# Patient Record
Sex: Male | Born: 1985 | Race: Black or African American | Hispanic: No | Marital: Single | Smoking: Current every day smoker
Health system: Southern US, Community
[De-identification: ages and names within clinical notes are randomized; demographics above are authoritative.]

## PROBLEM LIST (undated history)

## (undated) DIAGNOSIS — R569 Unspecified convulsions: Secondary | ICD-10-CM

## (undated) DIAGNOSIS — I10 Essential (primary) hypertension: Secondary | ICD-10-CM

## (undated) HISTORY — DX: Essential (primary) hypertension: I10

---

## 2014-10-20 ENCOUNTER — Emergency Department (HOSPITAL_COMMUNITY)
Admission: EM | Admit: 2014-10-20 | Discharge: 2014-10-20 | Disposition: A | Discharge status: Home / Self Care | Payer: Self-pay | Attending: Emergency Medicine | Admitting: Emergency Medicine

## 2014-10-20 ENCOUNTER — Encounter (HOSPITAL_COMMUNITY): Payer: Self-pay | Admitting: Emergency Medicine

## 2014-10-20 ENCOUNTER — Emergency Department (HOSPITAL_COMMUNITY): Payer: Self-pay

## 2014-10-20 DIAGNOSIS — Z23 Encounter for immunization: Secondary | ICD-10-CM | POA: Insufficient documentation

## 2014-10-20 DIAGNOSIS — S61011A Laceration without foreign body of right thumb without damage to nail, initial encounter: Secondary | ICD-10-CM | POA: Insufficient documentation

## 2014-10-20 DIAGNOSIS — Y288XXA Contact with other sharp object, undetermined intent, initial encounter: Secondary | ICD-10-CM | POA: Insufficient documentation

## 2014-10-20 DIAGNOSIS — Y9289 Other specified places as the place of occurrence of the external cause: Secondary | ICD-10-CM | POA: Insufficient documentation

## 2014-10-20 DIAGNOSIS — Z792 Long term (current) use of antibiotics: Secondary | ICD-10-CM | POA: Insufficient documentation

## 2014-10-20 DIAGNOSIS — Y9389 Activity, other specified: Secondary | ICD-10-CM | POA: Insufficient documentation

## 2014-10-20 DIAGNOSIS — Z720 Tobacco use: Secondary | ICD-10-CM | POA: Insufficient documentation

## 2014-10-20 DIAGNOSIS — Y990 Civilian activity done for income or pay: Secondary | ICD-10-CM | POA: Insufficient documentation

## 2014-10-20 MED ORDER — TETANUS-DIPHTH-ACELL PERTUSSIS 5-2.5-18.5 LF-MCG/0.5 IM SUSP
0.5000 mL | Freq: Once | INTRAMUSCULAR | Status: AC
  Administered 2014-10-20: 0.5 mL via INTRAMUSCULAR
  Filled 2014-10-20: qty 0.5

## 2014-10-20 MED ORDER — BACITRACIN ZINC 500 UNIT/GM EX OINT
1.0000 "application " | TOPICAL_OINTMENT | Freq: Two times a day (BID) | CUTANEOUS | Status: DC
  Administered 2014-10-20: 1 via TOPICAL
  Filled 2014-10-20: qty 0.9

## 2014-10-20 MED ORDER — TRAMADOL HCL 50 MG PO TABS: 50.0000 mg | ORAL_TABLET | Freq: Four times a day (QID) | ORAL | Status: DC | PRN

## 2014-10-20 MED ORDER — CEPHALEXIN 500 MG PO CAPS: 500.0000 mg | ORAL_CAPSULE | Freq: Four times a day (QID) | ORAL | Status: DC

## 2014-10-20 MED ORDER — LIDOCAINE HCL 2 % IJ SOLN
10.0000 mL | Freq: Once | INTRAMUSCULAR | Status: AC
  Administered 2014-10-20: 200 mg via INTRADERMAL
  Filled 2014-10-20: qty 20

## 2014-10-20 MED ORDER — TRAMADOL HCL 50 MG PO TABS
50.0000 mg | ORAL_TABLET | Freq: Once | ORAL | Status: AC
Start: 2014-10-20 — End: 2014-10-20
  Administered 2014-10-20: 50 mg via ORAL
  Filled 2014-10-20: qty 1

## 2014-10-20 NOTE — Discharge Instructions (Signed)
Delayed Wound Closure Sometimes, your health care provider will decide to delay closing a wound for several days. This is done when the wound is badly bruised, dirty, or when it has been several hours since the injury happened. By delaying the closure of your wound, the risk of infection is reduced. Wounds that are closed in 3-7 days after being cleaned up and dressed heal just as well as those that are closed right away. HOME CARE INSTRUCTIONS  Rest and elevate the injured area until the pain and swelling are gone.  Have your wound checked as instructed by your health care provider. SEEK MEDICAL CARE IF:  You develop unusual or increased swelling or redness around the wound.  You have increasing pain or tenderness.  There is increasing fluid (drainage) or a bad smelling drainage coming from the wound.   This information is not intended to replace advice given to you by your health care provider. Make sure you discuss any questions you have with your health care provider.   Document Released: 12/25/2004 Document Revised: 12/30/2012 Document Reviewed: 06/24/2012 Elsevier Interactive Patient Education 2016 [employer]  Laceration Care, Adult A laceration is a cut that goes through all layers of the skin. The cut also goes into the tissue that is right under the skin. Some cuts heal on their own. Others need to be closed with stitches (sutures), staples, skin adhesive strips, or wound glue. Taking care of your cut lowers your risk of infection and helps your cut to heal better. HOW TO TAKE CARE OF YOUR CUT For stitches or staples:  Keep the wound clean and dry.  If you were given a bandage (dressing), you should change it at least one time per day or as told by your doctor. You should also change it if it gets wet or dirty.  Keep the wound completely dry for the first 24 hours or as told by your doctor. After that time, you may take a shower or a bath. However, make sure that the wound  is not soaked in water until after the stitches or staples have been removed.  Clean the wound one time each day or as told by your doctor:  Wash the wound with soap and water.  Rinse the wound with water until all of the soap comes off.  Pat the wound dry with a clean towel. Do not rub the wound.  After you clean the wound, put a thin layer of antibiotic ointment on it as told by your doctor. This ointment:  Helps to prevent infection.  Keeps the bandage from sticking to the wound.  Have your stitches or staples removed as told by your doctor. If your doctor used skin adhesive strips:   Keep the wound clean and dry.  If you were given a bandage, you should change it at least one time per day or as told by your doctor. You should also change it if it gets dirty or wet.  Do not get the skin adhesive strips wet. You can take a shower or a bath, but be careful to keep the wound dry.  If the wound gets wet, pat it dry with a clean towel. Do not rub the wound.  Skin adhesive strips fall off on their own. You can trim the strips as the wound heals. Do not remove any strips that are still stuck to the wound. They will fall off after a while. If your doctor used wound glue:  Try to keep your wound  dry, but you may briefly wet it in the shower or bath. Do not soak the wound in water, such as by swimming.  After you take a shower or a bath, gently pat the wound dry with a clean towel. Do not rub the wound.  Do not do any activities that will make you really sweaty until the skin glue has fallen off on its own.  Do not apply liquid, cream, or ointment medicine to your wound while the skin glue is still on.  If you were given a bandage, you should change it at least one time per day or as told by your doctor. You should also change it if it gets dirty or wet.  If a bandage is placed over the wound, do not let the tape for the bandage touch the skin glue.  Do not pick at the glue. The  skin glue usually stays on for 5-10 days. Then, it falls off of the skin. General Instructions  To help prevent scarring, make sure to cover your wound with sunscreen whenever you are outside after stitches are removed, after adhesive strips are removed, or when wound glue stays in place and the wound is healed. Make sure to wear a sunscreen of at least 30 SPF.  Take over-the-counter and prescription medicines only as told by your doctor.  If you were given antibiotic medicine or ointment, take or apply it as told by your doctor. Do not stop using the antibiotic even if your wound is getting better.  Do not scratch or pick at the wound.  Keep all follow-up visits as told by your doctor. This is important.  Check your wound every day for signs of infection. Watch for:  Redness, swelling, or pain.  Fluid, blood, or pus.  Raise (elevate) the injured area above the level of your heart while you are sitting or lying down, if possible. GET HELP IF:  You got a tetanus shot and you have any of these problems at the injection site:  Swelling.  Very bad pain.  Redness.  Bleeding.  You have a fever.  A wound that was closed breaks open.  You notice a bad smell coming from your wound or your bandage.  You notice something coming out of the wound, such as wood or glass.  Medicine does not help your pain.  You have more redness, swelling, or pain at the site of your wound.  You have fluid, blood, or pus coming from your wound.  You notice a change in the color of your skin near your wound.  You need to change the bandage often because fluid, blood, or pus is coming from the wound.  You start to have a new rash.  You start to have numbness around the wound. GET HELP RIGHT AWAY IF:  You have very bad swelling around the wound.  Your pain suddenly gets worse and is very bad.  You notice painful lumps near the wound or on skin that is anywhere on your body.  You have a red  streak going away from your wound.  The wound is on your hand or foot and you cannot move a finger or toe like you usually can.  The wound is on your hand or foot and you notice that your fingers or toes look pale or bluish.   This information is not intended to replace advice given to you by your health care provider. Make sure you discuss any questions you have with your health  care provider.   Document Released: 06/13/2007 Document Revised: 05/11/2014 Document Reviewed: 12/21/2013 Elsevier Interactive Patient Education 2016 Else[employer] Inc[employer]  Sutured Wound Care Sutures are stitches that can be used to close wounds. Taking care of your wound properly can help prevent pain and infection. It can also help your wound to heal more quickly. HOW TO CARE FOR YOUR SUTURED WOUND Wound Care  Keep the wound clean and dry.  If you were given a bandage (dressing), change it at least one time per day or as told by your doctor. You should also change it if it gets wet or dirty.  Keep the wound completely dry for the first 24 hours or as told by your doctor. After that time, you may shower or bathe. However, make sure that the wound is not soaked in water until the sutures have been removed.  Clean the wound one time each day or as told by your doctor.  Wash the wound with soap and water.  Rinse the wound with water to remove all soap.  Pat the wound dry with a clean towel. Do not rub the wound.  After cleaning the wound, put a thin layer of antibiotic ointment on it as told your doctor. This ointment:  Helps to prevent infection.  Keeps the bandage from sticking to the wound.  Have the sutures removed as told by your doctor. General Instructions  Take or apply medicines only as told by your doctor.  To help prevent scarring, make sure to cover your wound with sunscreen whenever you are outside after the sutures are removed and the wound is healed. Make sure to wear a sunscreen of at least  30 SPF.  If you were prescribed an antibiotic medicine or ointment, finish all of it even if you start to feel better.  Do not scratch or pick at the wound.  Keep all follow-up visits as told by your doctor. This is important.  Check your wound every day for signs of infection. Watch for:  Redness, swelling, or pain.  Fluid, blood, or pus.  Raise (elevate) the injured area above the level of your heart while you are sitting or lying down, if possible.  Avoid stretching your wound.  Drink enough fluids to keep your pee (urine) clear or pale yellow. GET HELP IF:  You were given a tetanus shot and you have any of these where the needle went in:  Swelling.  Very bad pain.  Redness.  Bleeding.  You have a fever.  A wound that was closed breaks open.  You notice a bad smell coming from the wound.  You notice something coming out of the wound, such as wood or glass.  Medicine does not help your pain.  You have any of these at the site of the wound.  More redness.  More swelling.  More pain.  You have any of these coming from the wound.  Fluid.  Blood.  Pus.  You notice a change in the color of your skin near the wound.  You need to change the bandage often due to fluid, blood, or pus coming from the wound.  You have a new rash.  You have numbness around the wound. GET HELP RIGHT AWAY IF:  You have very bad swelling around the wound.  Your pain suddenly gets worse and is very bad.  You have painful lumps near the wound or on skin that is anywhere on your body.  You have a red streak going away from  the wound.  The wound is on your hand or foot and you cannot move a finger or toe like normal.  The wound is on your hand or foot and you notice that your fingers or toes look pale or bluish.   This information is not intended to replace advice given to you by your health care provider. Make sure you discuss any questions you have with your health care  provider.   Document Released: 06/13/2007 Document Revised: 05/11/2014 Document Reviewed: 08/06/2012 Elsevier Interactive Patient Education [employer]

## 2014-10-20 NOTE — ED Notes (Signed)
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  States that he cut his rt thumb on metal yesterday.  Last tetanus shot unknown.

## 2014-10-20 NOTE — ED Provider Notes (Signed)
CSN: [id number]     Arrival date & time 10/20/14  1234 History  By signing my name below, I, Placido Sou, attest that this documentation has been prepared under the direction and in the presence of Danelle Berry, PA-C. Electronically Signed: Placido Sou, ED Scribe. 10/20/2014. 2:17 PM.   Chief Complaint  Patient presents with  . Extremity Laceration   The history is provided by the patient. No language interpreter was used.    HPI Comments: Jimmy Howard is a 29 y.o. male who presents to the Emergency Department complaining of a laceration with controlled bleeding to his right thumb with onset 1 day ago. 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 Pt notes associated 7/10 pain from the thumb and mild tingling. He denies having taken anything for pain management. He notes applying peroxide initially but denies having irrigated the wound. He denies any bleeding disorders, other known medical issues or drug allergies. Pt received a tdap booster upon arrival today. Pt denies any other associated symptoms.   History reviewed. No pertinent past medical history. No past surgical history on file. No family history on file. Social History  Substance Use Topics  . Smoking status: Current Every Day Smoker  . Smokeless tobacco: None  . Alcohol Use: No    Review of Systems  Constitutional: Negative for fever, chills, diaphoresis and fatigue.  Gastrointestinal: Negative for nausea, vomiting, diarrhea and constipation.  Skin: Positive for color change and wound.  Neurological: Negative for tremors, weakness and numbness.  Hematological: Does not bruise/bleed easily.   Allergies  Review of patient's allergies indicates no known allergies.  Home Medications   Prior to Admission medications   Medication Sig Start Date End Date Taking? Authorizing Provider  cephALEXin (KEFLEX) 500 MG capsule Take 1 capsule (500 mg total) by mouth 4 (four) times daily.  10/20/14   Danelle Berry, PA-C  traMADol (ULTRAM) 50 MG tablet Take 1 tablet (50 mg total) by mouth every 6 (six) hours as needed for severe pain. 10/20/14   Danelle Berry, PA-C   BP 138/78 mmHg  Pulse 82  Temp(Src) 97.9 F (36.6 C) (Oral)  Resp 18  SpO2 100% Physical Exam  Constitutional: He is oriented to person, place, and time. He appears well-developed and well-nourished. No distress.  HENT:  Head: Normocephalic and atraumatic.  Right Ear: External ear normal.  Left Ear: External ear normal.  Nose: Nose normal.  Mouth/Throat: Oropharynx is clear and moist. No oropharyngeal exudate.  Eyes: Conjunctivae and EOM are normal. Pupils are equal, round, and reactive to light. Right eye exhibits no discharge. Left eye exhibits no discharge. No scleral icterus.  Neck: Normal range of motion. Neck supple. No JVD present. No tracheal deviation present.  Cardiovascular: Normal rate and regular rhythm.   Pulmonary/Chest: Effort normal and breath sounds normal. No stridor. No respiratory distress.  Musculoskeletal: Normal range of motion. He exhibits no edema.  Lymphadenopathy:    He has no cervical adenopathy.  Neurological: He is alert and oriented to person, place, and time. He exhibits normal muscle tone. Coordination normal.  Skin: Skin is warm and dry. Laceration noted. No rash noted. He is not diaphoretic. No erythema. No pallor.     Right distal thumb laceration with skin flap approximately 2 cm x 1 cm with exposed subcutaneous tissue, no visible bone or tendon involvement ; pink nailbed; normal cap refill; normal ROM;   Psychiatric: He has a normal mood and affect.  His behavior is normal. Judgment and thought content normal.                  ED Course  Procedures  DIAGNOSTIC STUDIES: Oxygen Saturation is 97% on RA, normal by my interpretation.    LACERATION REPAIR Performed by: Danelle BerryLeisa Rumeal Cullipher Consent: Verbal consent obtained. Risks and benefits: risks, benefits and  alternatives were discussed Patient identity confirmed: provided demographic data Time out performed prior to procedure Prepped and Draped in normal sterile fashion Wound explored Laceration Location: right thumb Laceration Length: 2cm No Foreign Bodies seen or palpated Anesthesia: local infiltration Digital block: lidocaine 2% w/o epi Anesthetic total: 6 mL Irrigation method: syringe Amount of cleaning: standard  Skin closure: 5.0 prolene Number of sutures or staples: 4 Technique: simple interrupted (3) and horizontal mattress suture (1) - to distribute tension Patient tolerance: Patient tolerated the procedure well with no immediate complications.   COORDINATION OF CARE: 2:10 PM Discussed treatment plan with pt at bedside and pt agreed to plan.  Labs Review Labs Reviewed - No data to display  Imaging Review Dg Hand Complete Right  10/20/2014  CLINICAL DATA:  Anterior thumb laceration 1 day ago using table saw. EXAM: RIGHT HAND - COMPLETE 3+ VIEW COMPARISON:  None. FINDINGS: Soft tissue defect noted at the tip of the right thumb. No underlying bony abnormality. No fracture, subluxation or dislocation. No radiopaque foreign body. IMPRESSION: No acute bony abnormality. Electronically Signed   By: Charlett NoseKevin  Dover M.D.   On: 10/20/2014 13:31   I have personally reviewed and evaluated these images as part of my medical decision-making.   EKG Interpretation None     MDM   Final diagnoses:  Thumb laceration, right, initial encounter    Delayed presentation of thumb laceration. The thumb was numbed, irrigated, examined in a bloodless field, excess tissue trimmed and edges approximated using skin flap that had attached dermis (blood supply).  Not all areas were able to be approximated as there were some gaping edges that will need to heal secondarily due to missing tissue.  Proper wound care was reviewed with the pt.  He was advised to have his wound checked in 3-5 days, sutures to  remain 7-10 days, prophylactic Abx given with pain medication. No signs of current infection.  I personally performed the services described in this documentation, which was scribed in my presence. The recorded information has been reviewed and is accurate.    Danelle BerryLeisa Jawad Wiacek, PA-C 10/29/14 [id number]  Arby BarretteMarcy Pfeiffer, MD 11/08/14 [phone]

## 2017-04-16 IMAGING — CR DG HAND COMPLETE 3+V*R*
3 series · 3 of 3 positions shown · non-contrast
Comparison: None.

CLINICAL DATA: Anterior thumb laceration 1 day ago using table saw.

EXAM:
RIGHT HAND - COMPLETE 3+ VIEW

[x hand pa right]
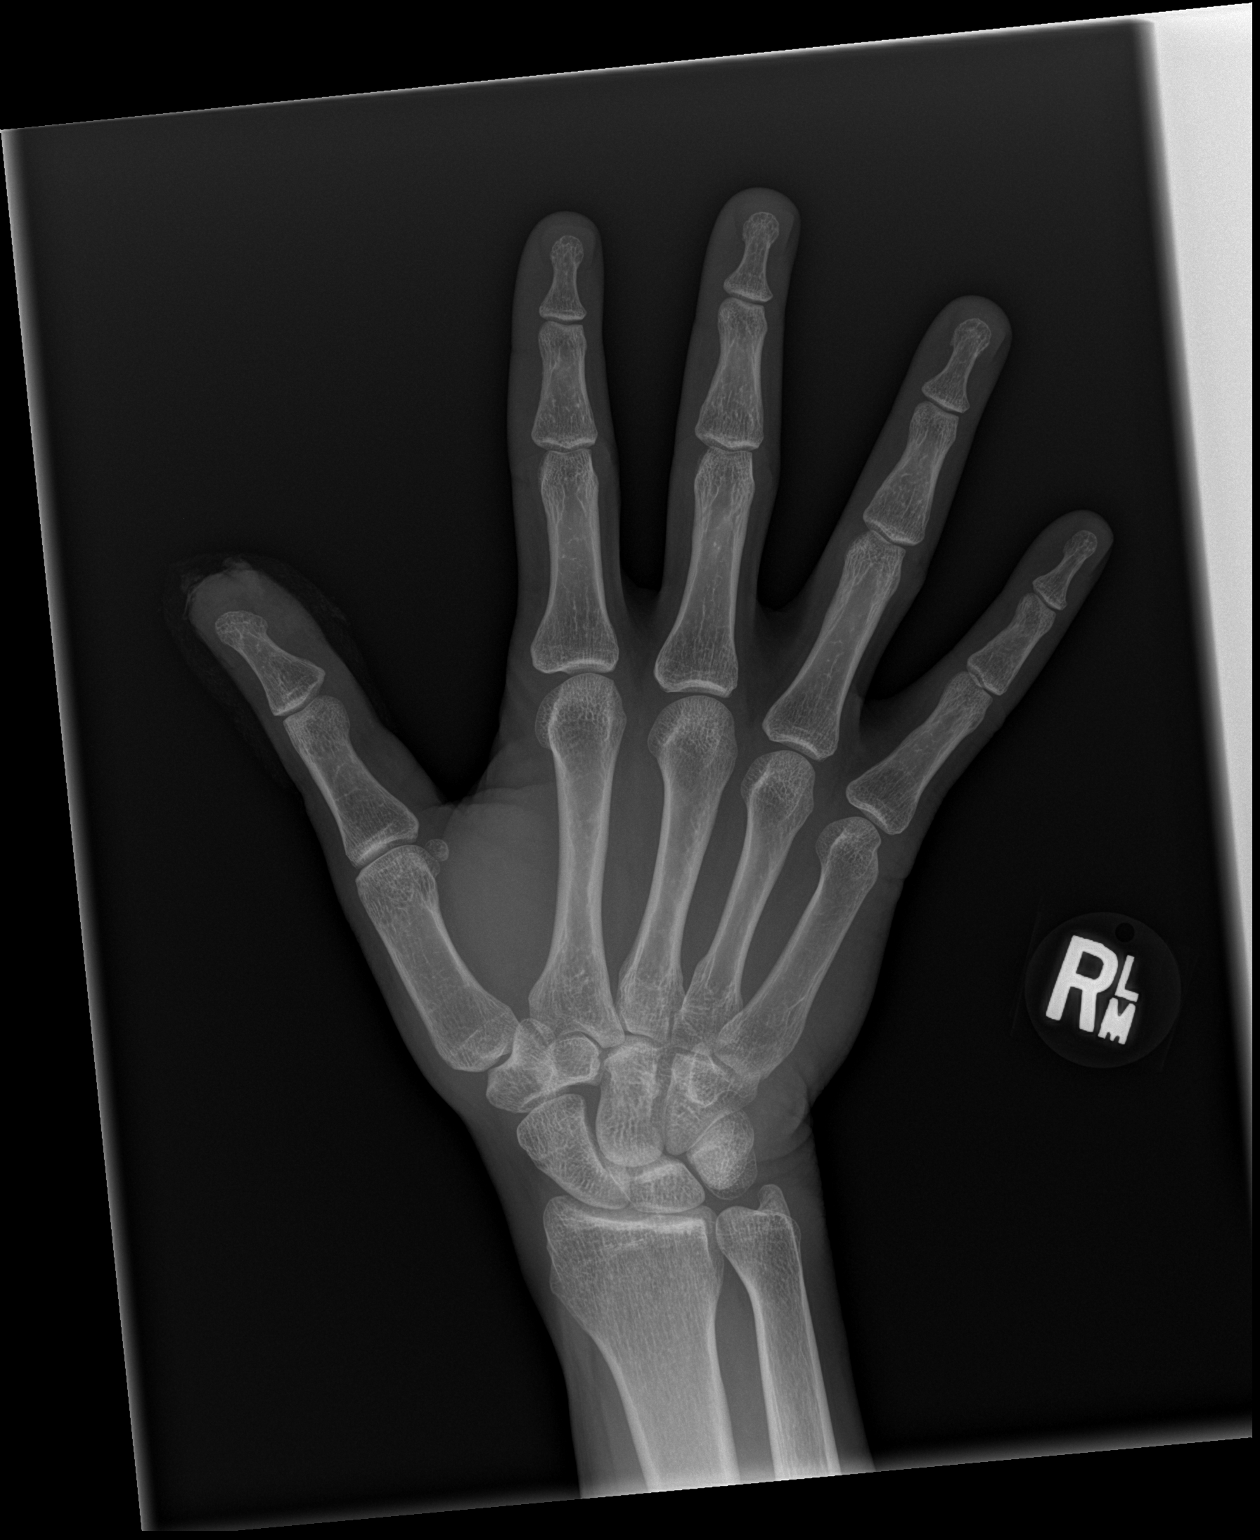

[x hand obl right]
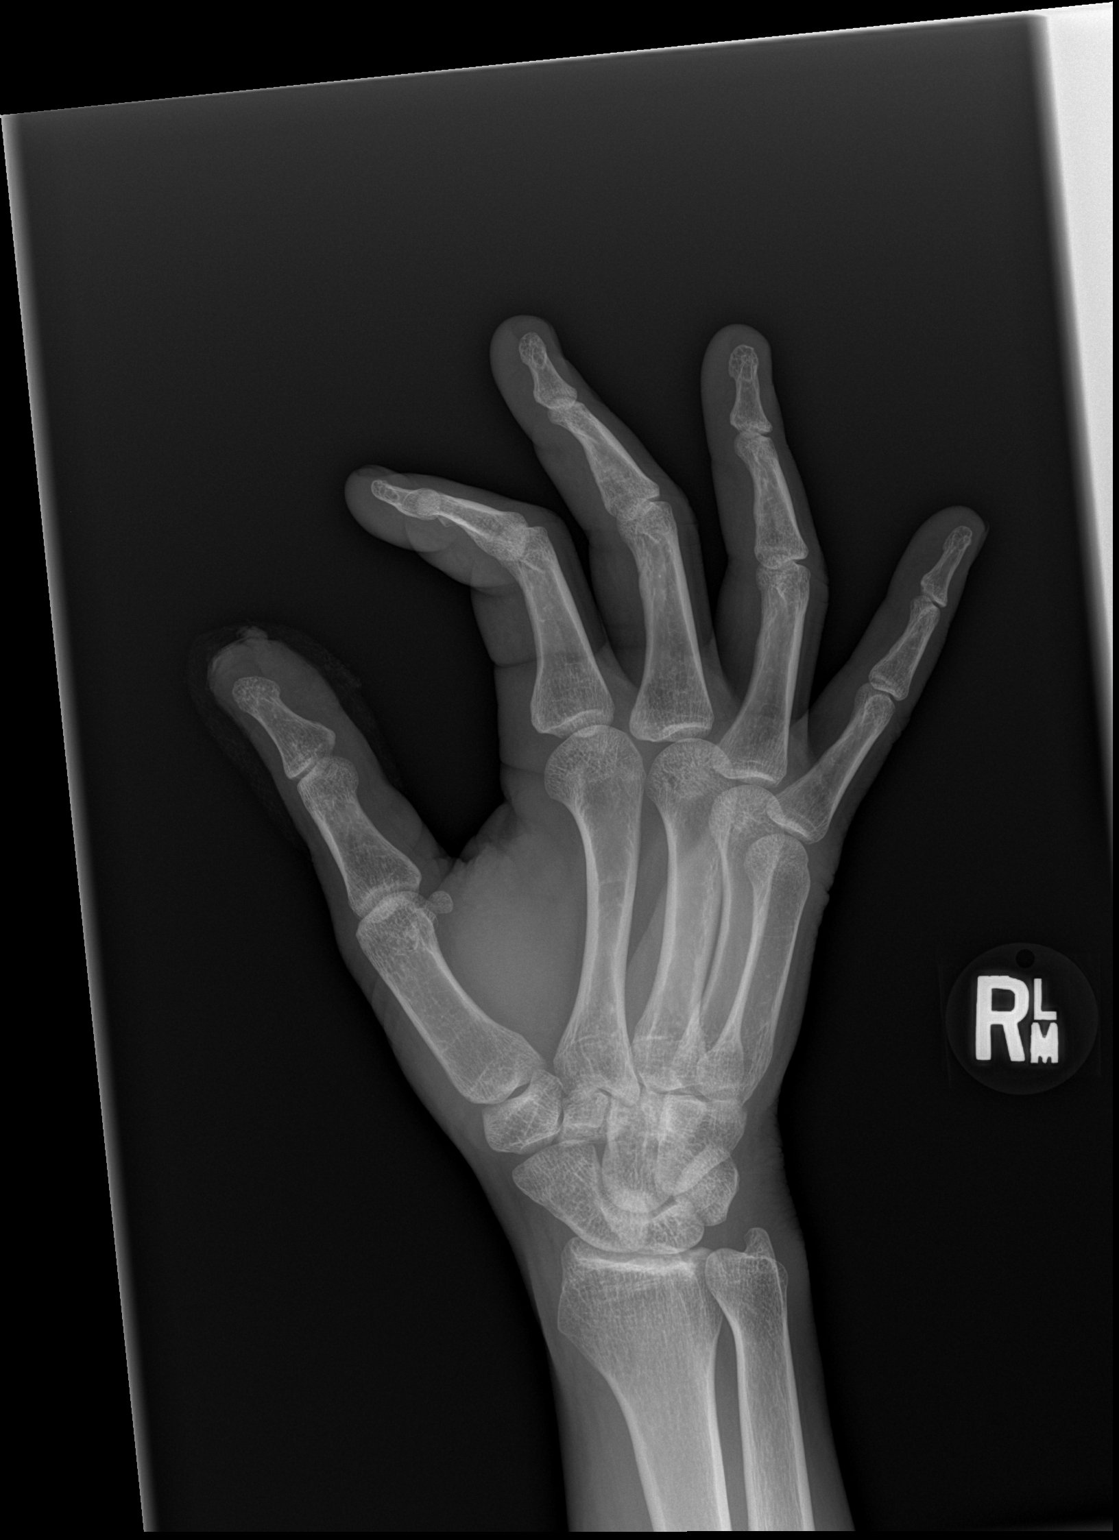

[x hand lat right]
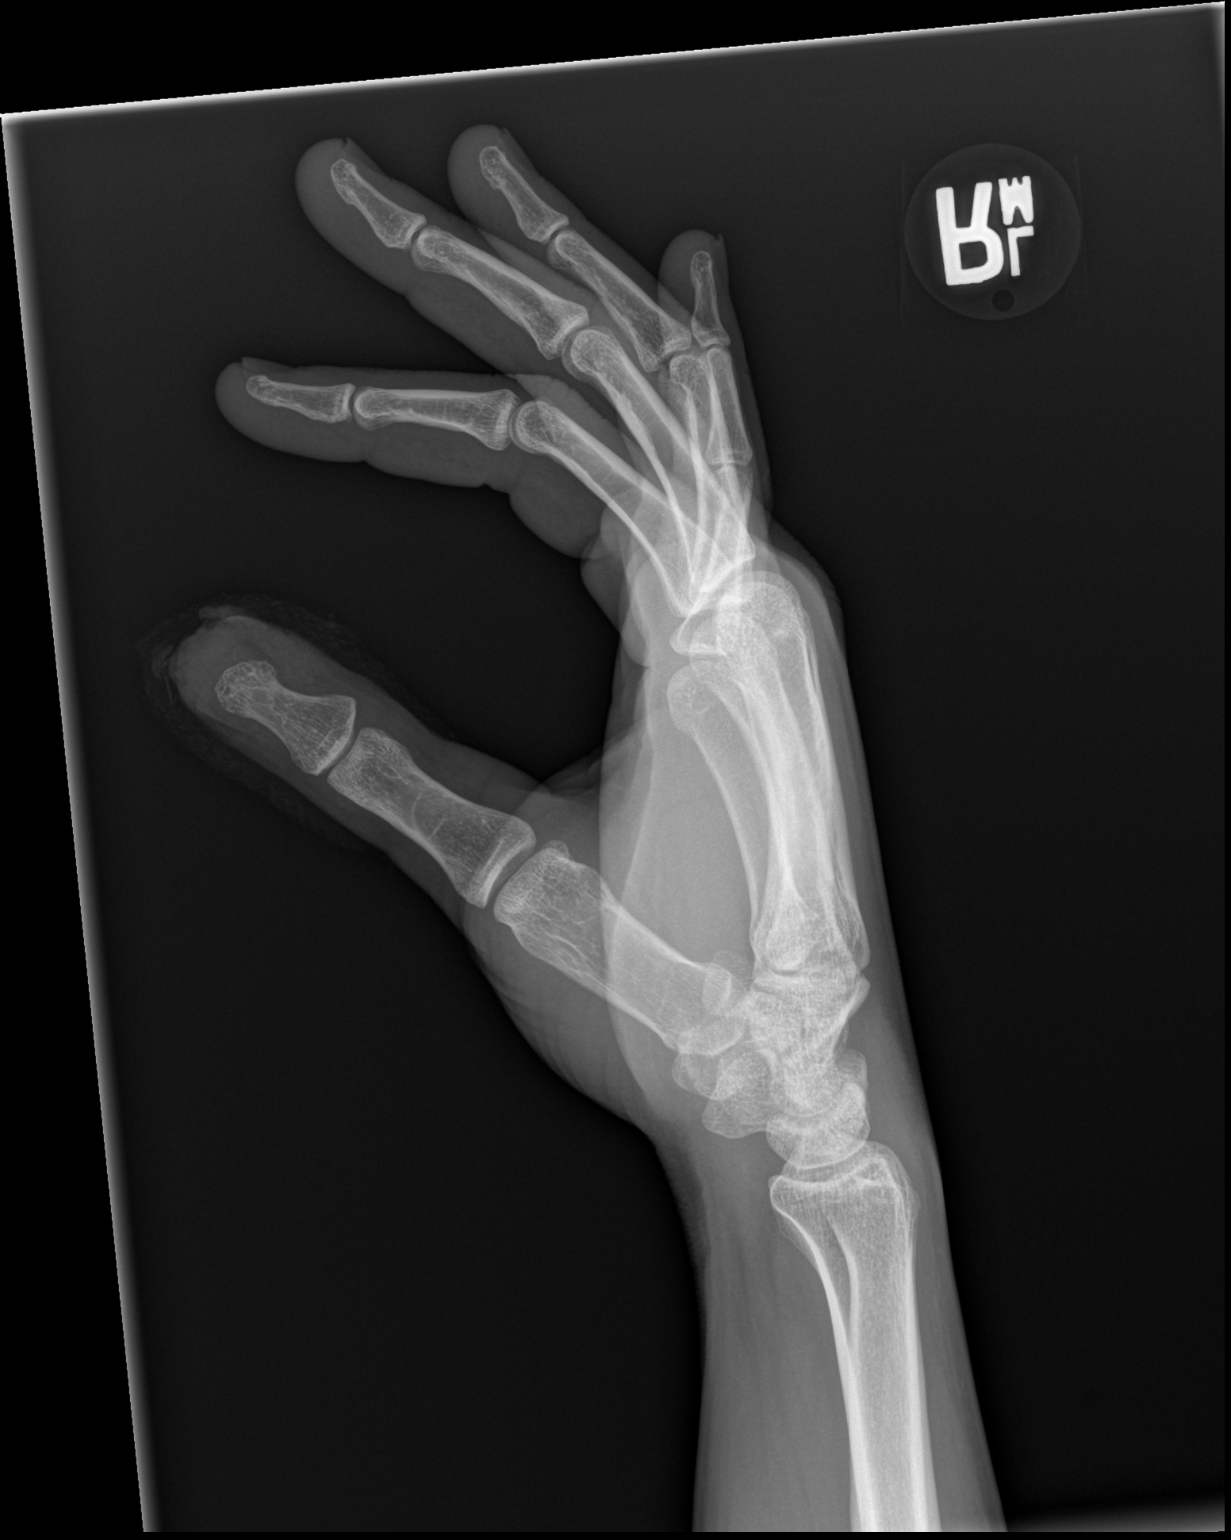

[3 of 3 positions shown; findings below may reference images not displayed]

FINDINGS: Soft tissue defect noted at the tip of the right thumb. No
underlying bony abnormality. No fracture, subluxation or
dislocation. No radiopaque foreign body.
IMPRESSION: No acute bony abnormality.

## 2018-04-22 ENCOUNTER — Ambulatory Visit: Payer: Self-pay | Admitting: Family Medicine

## 2021-09-13 ENCOUNTER — Ambulatory Visit
Admission: EM | Admit: 2021-09-13 | Discharge: 2021-09-13 | Disposition: A | Discharge status: Home / Self Care | Payer: Self-pay | Attending: Physician Assistant | Admitting: Physician Assistant

## 2021-09-13 DIAGNOSIS — L309 Dermatitis, unspecified: Secondary | ICD-10-CM

## 2021-09-13 MED ORDER — TRIAMCINOLONE ACETONIDE 0.1 % EX CREA: 1.0000 | TOPICAL_CREAM | Freq: Two times a day (BID) | CUTANEOUS | 0 refills | Status: DC

## 2021-09-13 MED ORDER — DOXYCYCLINE HYCLATE 100 MG PO CAPS: 100.0000 mg | ORAL_CAPSULE | Freq: Two times a day (BID) | ORAL | 0 refills | Status: DC

## 2021-09-13 NOTE — ED Triage Notes (Signed)
Pt presents with lesions on both legs that is very itchy.

## 2021-09-13 NOTE — ED Provider Notes (Signed)
[name]    CSN: [id number] Arrival date & time: 09/13/21  0953      History   Chief Complaint Chief Complaint  Patient presents with   Rash    HPI Jimmy Howard is a 36 y.o. male.   Patient here today for evaluation of rash to his lower legs that is very itchy.  He reports he first noticed rash a few days ago and the rash to his right leg has seemed to worsen in appearance.  He has not had fever.  He denies any nausea, vomiting or diarrhea.  He has not had any trouble breathing or swallowing.  He has tried cocoa butter and Nivea without significant improvement.  The history is provided by the patient.  Rash Associated symptoms: no fever     History reviewed. No pertinent past medical history.  There are no problems to display for this patient.   History reviewed. No pertinent surgical history.     Home Medications    Prior to Admission medications   Medication Sig Start Date End Date Taking? Authorizing Provider  doxycycline (VIBRAMYCIN) 100 MG capsule Take 1 capsule (100 mg total) by mouth 2 (two) times daily. 09/13/21  Yes Tomi Bamberger, PA-C  triamcinolone cream (KENALOG) 0.1 % Apply 1 Application topically 2 (two) times daily. 09/13/21  Yes Tomi Bamberger, PA-C    Family History Family History  Family history unknown: Yes    Social History Social History   Tobacco Use   Smoking status: Every Day  Substance Use Topics   Alcohol use: No   Drug use: No     Allergies   Patient has no known allergies.   Review of Systems Review of Systems  Constitutional:  Negative for chills and fever.  Eyes:  Negative for discharge and redness.  Skin:  Positive for rash.  Neurological:  Negative for numbness.     Physical Exam Triage Vital Signs ED Triage Vitals  Enc Vitals Group     BP 09/13/21 1045 (!) 138/95     Pulse Rate 09/13/21 1045 (!) 106     Resp 09/13/21 1045 17     Temp 09/13/21 1045 97.9 F (36.6 C)     Temp Source  09/13/21 1045 Oral     SpO2 09/13/21 1045 97 %     Weight --      Height --      Head Circumference --      Peak Flow --      Pain Score 09/13/21 1049 0     Pain Loc --      Pain Edu? --      Excl. in GC? --    No data found.  Updated Vital Signs BP (!) 138/95 (BP Location: Left Arm)   Pulse (!) 106   Temp 97.9 F (36.6 C) (Oral)   Resp 17   SpO2 97%      Physical Exam Vitals and nursing note reviewed.  Constitutional:      General: He is not in acute distress.    Appearance: Normal appearance. He is not ill-appearing.  HENT:     Head: Normocephalic and atraumatic.  Eyes:     Conjunctiva/sclera: Conjunctivae normal.  Cardiovascular:     Rate and Rhythm: Normal rate.  Pulmonary:     Effort: Pulmonary effort is normal.  Skin:    Comments: Mildly erythematous vesicular papular rash to bilateral anterior lower legs, with right worse than left.  Right  rash also has central hyperpigmentation and is larger than left.  Neurological:     Mental Status: He is alert.  Psychiatric:        Mood and Affect: Mood normal.        Behavior: Behavior normal.        Thought Content: Thought content normal.      UC Treatments / Results  Labs (all labs ordered are listed, but only abnormal results are displayed) Labs Reviewed - No data to display  EKG   Radiology No results found.  Procedures Procedures (including critical care time)  Medications Ordered in UC Medications - No data to display  Initial Impression / Assessment and Plan / UC Course  I have reviewed the triage vital signs and the nursing notes.  Pertinent labs & imaging results that were available during my care of the patient were reviewed by me and considered in my medical decision making (see chart for details).    Rash questionably caused by poison oak or poison ivy given location and appearance however concern for secondary infection to rash on right leg given appearance.  Will treat with steroid  cream and antibiotic therapy.  Encouraged follow-up if no gradual improvement or with any further concerns.  Final Clinical Impressions(s) / UC Diagnoses   Final diagnoses:  Dermatitis   Discharge Instructions   None    ED Prescriptions     Medication Sig Dispense Auth. Provider   triamcinolone cream (KENALOG) 0.1 % Apply 1 Application topically 2 (two) times daily. 30 g Tomi Bamberger, PA-C   doxycycline (VIBRAMYCIN) 100 MG capsule Take 1 capsule (100 mg total) by mouth 2 (two) times daily. 20 capsule Tomi Bamberger, PA-C      PDMP not reviewed this encounter.   Tomi Bamberger, PA-C 09/13/21 1155

## 2022-07-17 ENCOUNTER — Emergency Department (HOSPITAL_BASED_OUTPATIENT_CLINIC_OR_DEPARTMENT_OTHER): Payer: Self-pay | Admitting: Radiology

## 2022-07-17 ENCOUNTER — Ambulatory Visit
Admission: EM | Admit: 2022-07-17 | Discharge: 2022-07-17 | Disposition: A | Discharge status: Home / Self Care | Payer: Self-pay | Attending: Internal Medicine | Admitting: Internal Medicine

## 2022-07-17 ENCOUNTER — Encounter (HOSPITAL_BASED_OUTPATIENT_CLINIC_OR_DEPARTMENT_OTHER): Payer: Self-pay

## 2022-07-17 ENCOUNTER — Other Ambulatory Visit: Payer: Self-pay

## 2022-07-17 ENCOUNTER — Emergency Department (HOSPITAL_BASED_OUTPATIENT_CLINIC_OR_DEPARTMENT_OTHER)
Admission: EM | Admit: 2022-07-17 | Discharge: 2022-07-17 | Disposition: A | Discharge status: Home / Self Care | Payer: Self-pay | Attending: Emergency Medicine | Admitting: Emergency Medicine

## 2022-07-17 ENCOUNTER — Emergency Department (HOSPITAL_BASED_OUTPATIENT_CLINIC_OR_DEPARTMENT_OTHER): Payer: Self-pay

## 2022-07-17 DIAGNOSIS — R9431 Abnormal electrocardiogram [ECG] [EKG]: Secondary | ICD-10-CM

## 2022-07-17 DIAGNOSIS — Z79899 Other long term (current) drug therapy: Secondary | ICD-10-CM | POA: Insufficient documentation

## 2022-07-17 DIAGNOSIS — F1210 Cannabis abuse, uncomplicated: Secondary | ICD-10-CM | POA: Insufficient documentation

## 2022-07-17 DIAGNOSIS — R55 Syncope and collapse: Secondary | ICD-10-CM

## 2022-07-17 DIAGNOSIS — R000 Tachycardia, unspecified: Secondary | ICD-10-CM | POA: Insufficient documentation

## 2022-07-17 DIAGNOSIS — R079 Chest pain, unspecified: Secondary | ICD-10-CM

## 2022-07-17 DIAGNOSIS — I10 Essential (primary) hypertension: Secondary | ICD-10-CM | POA: Insufficient documentation

## 2022-07-17 DIAGNOSIS — E876 Hypokalemia: Secondary | ICD-10-CM | POA: Insufficient documentation

## 2022-07-17 DIAGNOSIS — F17210 Nicotine dependence, cigarettes, uncomplicated: Secondary | ICD-10-CM | POA: Insufficient documentation

## 2022-07-17 DIAGNOSIS — W19XXXA Unspecified fall, initial encounter: Secondary | ICD-10-CM

## 2022-07-17 DIAGNOSIS — E860 Dehydration: Secondary | ICD-10-CM | POA: Insufficient documentation

## 2022-07-17 LAB — URINALYSIS, ROUTINE W REFLEX MICROSCOPIC
Bacteria, UA: NONE SEEN
Bilirubin Urine: NEGATIVE
Glucose, UA: NEGATIVE mg/dL
Leukocytes,Ua: NEGATIVE
Nitrite: NEGATIVE
Protein, ur: 100 mg/dL — AB
Specific Gravity, Urine: 1.021 (ref 1.005–1.030)
pH: 6 (ref 5.0–8.0)

## 2022-07-17 LAB — BASIC METABOLIC PANEL
Anion gap: 21 — ABNORMAL HIGH (ref 5–15)
BUN: 14 mg/dL (ref 6–20)
CO2: 23 mmol/L (ref 22–32)
Calcium: 9 mg/dL (ref 8.9–10.3)
Chloride: 93 mmol/L — ABNORMAL LOW (ref 98–111)
Creatinine, Ser: 1.81 mg/dL — ABNORMAL HIGH (ref 0.61–1.24)
GFR, Estimated: 48 mL/min — ABNORMAL LOW (ref >60)
Glucose, Bld: 124 mg/dL — ABNORMAL HIGH (ref 70–99)
Potassium: 2.8 mmol/L — ABNORMAL LOW (ref 3.5–5.1)
Sodium: 137 mmol/L (ref 135–145)

## 2022-07-17 LAB — TROPONIN I (HIGH SENSITIVITY)
Troponin I (High Sensitivity): 15 ng/L (ref <18)
Troponin I (High Sensitivity): 14 ng/L (ref <18)

## 2022-07-17 LAB — CBC
HCT: 37.7 % — ABNORMAL LOW (ref 39.0–52.0)
Hemoglobin: 12.7 g/dL — ABNORMAL LOW (ref 13.0–17.0)
MCH: 30 pg (ref 26.0–34.0)
MCHC: 33.7 g/dL (ref 30.0–36.0)
MCV: 88.9 fL (ref 80.0–100.0)
Platelets: 293 10*3/uL (ref 150–400)
RBC: 4.24 MIL/uL (ref 4.22–5.81)
RDW: 16.9 % — ABNORMAL HIGH (ref 11.5–15.5)
WBC: 8.9 10*3/uL (ref 4.0–10.5)
nRBC: 0 % (ref 0.0–0.2)

## 2022-07-17 LAB — RAPID URINE DRUG SCREEN, HOSP PERFORMED
Amphetamines: NOT DETECTED
Barbiturates: NOT DETECTED
Benzodiazepines: NOT DETECTED
Cocaine: NOT DETECTED
Opiates: NOT DETECTED
Tetrahydrocannabinol: POSITIVE — AB

## 2022-07-17 LAB — HEPATIC FUNCTION PANEL
ALT: 10 U/L (ref 0–44)
AST: 75 U/L — ABNORMAL HIGH (ref 15–41)
Albumin: 6.3 g/dL — ABNORMAL HIGH (ref 3.5–5.0)
Alkaline Phosphatase: 93 U/L (ref 38–126)
Bilirubin, Direct: 0.1 mg/dL (ref 0.0–0.2)
Indirect Bilirubin: 0.5 mg/dL (ref 0.3–0.9)
Total Bilirubin: 0.6 mg/dL (ref 0.3–1.2)
Total Protein: 9.3 g/dL — ABNORMAL HIGH (ref 6.5–8.1)

## 2022-07-17 LAB — CK: Total CK: 727 U/L — ABNORMAL HIGH (ref 49–397)

## 2022-07-17 LAB — TSH: TSH: 1.661 u[IU]/mL (ref 0.350–4.500)

## 2022-07-17 LAB — MAGNESIUM: Magnesium: 1.2 mg/dL — ABNORMAL LOW (ref 1.7–2.4)

## 2022-07-17 LAB — PHOSPHORUS: Phosphorus: 2.9 mg/dL (ref 2.5–4.6)

## 2022-07-17 MED ORDER — LACTATED RINGERS IV BOLUS
1000.0000 mL | Freq: Once | INTRAVENOUS | Status: AC
  Administered 2022-07-17: 1000 mL via INTRAVENOUS

## 2022-07-17 MED ORDER — POTASSIUM CHLORIDE CRYS ER 20 MEQ PO TBCR: 20.0000 meq | EXTENDED_RELEASE_TABLET | Freq: Two times a day (BID) | ORAL | 0 refills | Status: DC

## 2022-07-17 MED ORDER — AMLODIPINE BESYLATE 5 MG PO TABS: 5.0000 mg | ORAL_TABLET | Freq: Every day | ORAL | 1 refills | Status: DC

## 2022-07-17 MED ORDER — MAGNESIUM SULFATE 2 GM/50ML IV SOLN
2.0000 g | Freq: Once | INTRAVENOUS | Status: AC
  Administered 2022-07-17: 2 g via INTRAVENOUS
  Filled 2022-07-17: qty 50

## 2022-07-17 MED ORDER — POTASSIUM CHLORIDE 10 MEQ/100ML IV SOLN
10.0000 meq | INTRAVENOUS | Status: AC
  Administered 2022-07-17 (×3): 10 meq via INTRAVENOUS
  Filled 2022-07-17 (×3): qty 100

## 2022-07-17 MED ORDER — AMLODIPINE BESYLATE 5 MG PO TABS
5.0000 mg | ORAL_TABLET | Freq: Once | ORAL | Status: AC
  Administered 2022-07-17: 5 mg via ORAL
  Filled 2022-07-17: qty 1

## 2022-07-17 MED ORDER — POTASSIUM CHLORIDE CRYS ER 20 MEQ PO TBCR
40.0000 meq | EXTENDED_RELEASE_TABLET | Freq: Once | ORAL | Status: AC
  Administered 2022-07-17: 40 meq via ORAL
  Filled 2022-07-17: qty 2

## 2022-07-17 MED ORDER — MAGNESIUM 200 MG PO TABS: 200.0000 mg | ORAL_TABLET | Freq: Every day | ORAL | 0 refills | Status: DC

## 2022-07-17 NOTE — ED Provider Notes (Signed)
[place] EMERGENCY DEPARTMENT AT [facility] Provider Note   CSN: [id number] Arrival date & time: 07/17/22  1439     History  Chief Complaint  Patient presents with   Loss of Consciousness    Jimmy Howard is a 37 y.o. male.  HPI Patient already passed out at 7:30 PM at work yesterday.  He works in a [employer].  He was unloading a truck.  He reports he was working pretty briskly.  He reports it was hot in there.  However, he reports he had no symptoms prior to passing out.  He did not feel lightheaded, short of breath, chest pain, headache.  Reportedly his coworker said that he seemed to be choking on some saliva and rolled him onto his side.  The next thing the patient remembers was being escorted to the EMS truck.  Poorly his blood pressure was 190 systolic at that time.  Heart rate was elevated.  Patient determined he did not wish transport to the hospital and wanted to go home.  He reports he has had a little bit of pain on the left side of his chest and a focal area.  He thinks he might of fallen and hit that side of his chest.  Reports is not very bad now.  He reports he also has a small area on the back of his head on the left is a small knot on his left knee.  He reports none of them are very painful and at this point he feels well.  He has not had any ongoing problems with dizziness, shortness of breath or lightheadedness.  Did go to urgent care this morning for evaluation due to his passing out episode and was recommended to come to the emergency department for abnormal EKG.  Patient denies being aware of a history of hypertension.  However he has not had his blood pressure checked in maybe 5 years.   Patient reports he does smoke about 1 pack of cigarettes daily or every 2 days.  He smokes marijuana occasionally.  He reports occasional alcohol use.  Last alcohol use was Sunday evening.   Patient denies significant family history.  He reports his grandfather  might of had heart problems with both of his parents are living and healthy.  Denies any siblings with significant medical problems.  Denies any knowledge of anyone with sudden death.    Home Medications Prior to Admission medications   Medication Sig Start Date End Date Taking? Authorizing Provider  amLODipine (NORVASC) 5 MG tablet Take 1 tablet (5 mg total) by mouth daily. 07/17/22  Yes Arby Barrette, MD  Magnesium 200 MG TABS Take 1 tablet (200 mg total) by mouth daily. 07/17/22  Yes Katricia Prehn, Lebron Conners, MD  potassium chloride SA (KLOR-CON M) 20 MEQ tablet Take 1 tablet (20 mEq total) by mouth 2 (two) times daily. 07/17/22  Yes Arby Barrette, MD  doxycycline (VIBRAMYCIN) 100 MG capsule Take 1 capsule (100 mg total) by mouth 2 (two) times daily. 09/13/21   Tomi Bamberger, PA-C  triamcinolone cream (KENALOG) 0.1 % Apply 1 Application topically 2 (two) times daily. 09/13/21   Tomi Bamberger, PA-C      Allergies    Patient has no known allergies.    Review of Systems   Review of Systems  Physical Exam Updated Vital Signs BP (!) 177/114   Pulse 79   Temp 98.4 F (36.9 C) (Oral)   Resp 15   Ht 6\' 1"  (  1.854 m)   Wt 72.6 kg   SpO2 100%   BMI 21.12 kg/m  Physical Exam Constitutional:      Comments: Well-nourished well-developed.  Good physical condition.  No distress.  Mental status clear.  No respiratory distress.  HENT:     Mouth/Throat:     Mouth: Mucous membranes are moist.     Pharynx: Oropharynx is clear.  Eyes:     Extraocular Movements: Extraocular movements intact.     Pupils: Pupils are equal, round, and reactive to light.  Cardiovascular:     Comments:  Tachycardia no significant rub murmur gallop appreciated.  Chest wall nontender.  No crepitus. Pulmonary:     Effort: Pulmonary effort is normal.     Breath sounds: Normal breath sounds.  Abdominal:     General: There is no distension.     Palpations: Abdomen is soft.     Tenderness: There is no abdominal tenderness.  There is no guarding.  Musculoskeletal:        General: No swelling or tenderness. Normal range of motion.     Cervical back: Neck supple.     Right lower leg: No edema.     Left lower leg: No edema.  Skin:    General: Skin is warm and dry.  Neurological:     General: No focal deficit present.     Mental Status: He is oriented to person, place, and time.     Coordination: Coordination normal.  Psychiatric:        Mood and Affect: Mood normal.     ED Results / Procedures / Treatments   Labs (all labs ordered are listed, but only abnormal results are displayed) Labs Reviewed  BASIC METABOLIC PANEL - Abnormal; Notable for the following components:      Result Value   Potassium 2.8 (*)    Chloride 93 (*)    Glucose, Bld 124 (*)    Creatinine, Ser 1.81 (*)    GFR, Estimated 48 (*)    Anion gap 21 (*)    All other components within normal limits  CBC - Abnormal; Notable for the following components:   Hemoglobin 12.7 (*)    HCT 37.7 (*)    RDW 16.9 (*)    All other components within normal limits  MAGNESIUM - Abnormal; Notable for the following components:   Magnesium 1.2 (*)    All other components within normal limits  HEPATIC FUNCTION PANEL - Abnormal; Notable for the following components:   Total Protein 9.3 (*)    Albumin 6.3 (*)    AST 75 (*)    All other components within normal limits  URINALYSIS, ROUTINE W REFLEX MICROSCOPIC - Abnormal; Notable for the following components:   APPearance HAZY (*)    Hgb urine dipstick TRACE (*)    Ketones, ur TRACE (*)    Protein, ur 100 (*)    All other components within normal limits  RAPID URINE DRUG SCREEN, HOSP PERFORMED - Abnormal; Notable for the following components:   Tetrahydrocannabinol POSITIVE (*)    All other components within normal limits  CK - Abnormal; Notable for the following components:   Total CK 727 (*)    All other components within normal limits  PHOSPHORUS  TSH  TROPONIN I (HIGH SENSITIVITY)   TROPONIN I (HIGH SENSITIVITY)    EKG EKG Interpretation Date/Time:  Tuesday July 17 2022 14:54:05 EDT Ventricular Rate:  116 PR Interval:  104 QRS Duration:  84 QT Interval:  446 QTC Calculation: 619 R Axis:   83  Text Interpretation: Sinus tachycardia with short PR Left ventricular hypertrophy with repolarization abnormality ( Sokolow-Lyon ) Prolonged QT Abnormal ECG When compared with ECG of 17-Jul-2022 11:44, PR interval has decreased T wave inversion now evident in Lateral leads Confirmed by Arby Barrette [phone]) on 07/17/2022 4:45:03 PM  Radiology CT Head Wo Contrast  Result Date: 07/17/2022 CLINICAL DATA:  Syncope/presyncope, cerebrovascular cause suspected EXAM: CT HEAD WITHOUT CONTRAST TECHNIQUE: Contiguous axial images were obtained from the base of the skull through the vertex without intravenous contrast. RADIATION DOSE REDUCTION: This exam was performed according to the departmental dose-optimization program which includes automated exposure control, adjustment of the mA and/or kV according to patient size and/or use of iterative reconstruction technique. COMPARISON:  None Available. FINDINGS: Brain: No evidence of acute infarction, hemorrhage, hydrocephalus, extra-axial collection or mass lesion/mass effect. Early mineralization of the basal ganglia bilaterally. Vascular: No hyperdense vessel or unexpected calcification. Skull: Normal. Negative for fracture or focal lesion. Sinuses/Orbits: No middle ear or mastoid effusion. Paranasal sinus clear. Orbits are unremarkable. Other: None. IMPRESSION: No acute intracranial abnormality. Electronically Signed   By: Lorenza Cambridge M.D.   On: 07/17/2022 17:21   DG Chest 2 View  Result Date: 07/17/2022 CLINICAL DATA:  Left lower chest pain.  Fall. EXAM: CHEST - 2 VIEW COMPARISON:  None Available. FINDINGS: The heart size and mediastinal contours are within normal limits. Both lungs are clear. The visualized skeletal structures are  unremarkable. IMPRESSION: No active cardiopulmonary disease. Electronically Signed   By: Larose Hires D.O.   On: 07/17/2022 15:42    Procedures Procedures    Medications Ordered in ED Medications  lactated ringers bolus 1,000 mL (0 mLs Intravenous Stopped 07/17/22 1939)  potassium chloride 10 mEq in 100 mL IVPB (0 mEq Intravenous Stopped 07/17/22 1939)  magnesium sulfate IVPB 2 g 50 mL (0 g Intravenous Stopped 07/17/22 1938)  lactated ringers bolus 1,000 mL (0 mLs Intravenous Stopped 07/17/22 2052)  potassium chloride SA (KLOR-CON M) CR tablet 40 mEq (40 mEq Oral Given 07/17/22 1939)  amLODipine (NORVASC) tablet 5 mg (5 mg Oral Given 07/17/22 2157)    ED Course/ Medical Decision Making/ A&P                             Medical Decision Making Amount and/or Complexity of Data Reviewed Labs: ordered. Radiology: ordered.  Risk OTC drugs. Prescription drug management.  Patient presents with a syncopal episode that occurred approximately 24 hours prior to presentation.  At that time he was noted to be hypertensive.  Patient denied prodromal symptoms.  On arrival patient is alert and no acute distress.  He is however tachycardic and EKG shows QT prolongation ST depression.  Will proceed with rehydration and lab work.  Troponin is normal.  CK7 27.  Basic chemistry panel normal sodium at 137 potassium of 2.8 BUN 14 creatinine 1.8 anion gap 21.  White blood cell count normal 8.9 H&H 12.7 and 37.  Urine drug screen positive for marijuana.  EKG interpreted by myself positive for QT prolongation and ST depression with probable LVH.  With rehydration and electrolyte replacement patient's heart rate has come down into the 70s and 80s.  Repeat EKG continues to show QT prolongation, normal sinus rhythm.  Patient remains asymptomatic.  He feels well.  No chest pain, dyspnea or headache.  Consult: Cardiology Dr. Ralene Ok.  Has reviewed EKGs and does  not find emergent changes.  We have reviewed the  patient's history of present illness and diagnostic evaluation.  At this time with patient rehydrated and electrolytes administered recommendation is for outpatient follow-up.  At this point patient is asymptomatic and wishes to be discharged.  He has been rehydrated and electrolytes replaced.  He may have baseline prolonged QT with LVH.  It appears likely patient has had untreated hypertension.  We have reviewed this extensively and at this point patient will be getting medical follow-up.  We reviewed potassium and magnesium replacement and dietary changes as well as smoking cessation and no marijuana.  Patient voices understanding and intention for following through on these things.  We have reviewed strict return precautions and avoiding excessive heat and physical exertion until he has completed all follow-up recommendations.        Final Clinical Impression(s) / ED Diagnoses Final diagnoses:  Syncope, unspecified syncope type  Dehydration after exertion  Hypokalemia  Hypomagnesemia  Hypertension, unspecified type    Rx / DC Orders ED Discharge Orders          Ordered    potassium chloride SA (KLOR-CON M) 20 MEQ tablet  2 times daily        07/17/22 2146    Magnesium 200 MG TABS  Daily        07/17/22 2146    amLODipine (NORVASC) 5 MG tablet  Daily        07/17/22 2153              Arby Barrette, MD 07/17/22 2211

## 2022-07-17 NOTE — Discharge Instructions (Addendum)
1.  Your blood pressure is probably been elevated for some time.  You need to monitor this very closely at home.  Start taking your blood pressure and writing it down 2-3 times a day.  Read the instructions for hypertension.  You have been given a blood pressure medication start called amlodipine.  Start taking it tomorrow.  Make an appointment with a family doctor soon as possible.  A resource guide for low-cost medical care is included in your discharge instructions. 2.  Hydrate well and to take your magnesium and potassium as prescribed.  Avoid strenuous work in heat until you have had your follow-up appointment with a family doctor and or cardiologist.  At any point you feel your heart racing, chest pain, shortness of breath or lightheadedness return to emergency department immediately for recheck.

## 2022-07-17 NOTE — ED Triage Notes (Signed)
Patient here POV from Home.  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 Unsure of Duration as he was found. EMS Responded but decided to go to UC today. Some Soreness to Left Lower Chest. No N/V/D.   NAD noted during Triage. A&Ox4. Gcs 15. Ambulatory.

## 2022-07-17 NOTE — ED Notes (Signed)
Patient is being discharged from the Urgent Care and sent to the Emergency Department via Private Vehicle . Per Laren Everts NP, patient is in need of higher level of care due to Chest Pain, Fall with LOC. Patient is aware and verbalizes understanding of plan of care.  Vitals:   07/17/22 1139 07/17/22 1140  BP: (!) 138/101 (!) 137/99  Pulse: (!) 122 (!) 112  Resp: 18 18  Temp: 98.9 F (37.2 C)   SpO2: 96%

## 2022-07-17 NOTE — ED Triage Notes (Signed)
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 EMS called "and they noted BP high with elevated HR". Not taken to ED. Pain continued in chest (mid to left) especially with movement. No sob. No respiratory distress. "When I passed out I was standing and then work up lying on floor", ? Hit left side of body. No lacerations. No abrasions.

## 2022-07-17 NOTE — ED Provider Notes (Signed)
[name]    CSN: [id number] Arrival date & time: 07/17/22  1130      History   Chief Complaint Chief Complaint  Patient presents with   Chest Pain    Post Fall (Injury 07-08 1930)    HPI KARL ERWAY is a 37 y.o. male.   Patient presents after syncopal episode that occurred yesterday while at work at approximately 7:30 PM.  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  States that it was witnessed by coworkers.  He states that he was told that he did hit his head but does not remember the event.  He is not sure how long he was out either.  States EMS was called and he was told that his blood pressure was 190 systolic and his heart rate was elevated.  Reports that he has been having chest pain ever since the fall on the left side of the chest.  Denies shortness of breath.  He is not sure if he landed on his chest or not.  Denies headache, blurred vision, dizziness, nausea, vomiting.  Patient denies any previous chronic health problems or cardiac etiology.  He does not take any daily medications.  Reports that he does not take any medication for this pain.   Chest Pain   History reviewed. No pertinent past medical history.  There are no problems to display for this patient.   History reviewed. No pertinent surgical history.     Home Medications    Prior to Admission medications   Medication Sig Start Date End Date Taking? Authorizing Provider  doxycycline (VIBRAMYCIN) 100 MG capsule Take 1 capsule (100 mg total) by mouth 2 (two) times daily. 09/13/21   Tomi Bamberger, PA-C  triamcinolone cream (KENALOG) 0.1 % Apply 1 Application topically 2 (two) times daily. 09/13/21   Tomi Bamberger, PA-C    Family History Family History  Family history unknown: Yes    Social History Social History   Tobacco Use   Smoking status: Some Days    Types: Cigarettes   Smokeless tobacco:  Never  Vaping Use   Vaping Use: Never used  Substance Use Topics   Alcohol use: No   Drug use: No     Allergies   Patient has no known allergies.   Review of Systems Review of Systems Per HPI  Physical Exam Triage Vital Signs ED Triage Vitals  Enc Vitals Group     BP 07/17/22 1139 (!) 138/101     Pulse Rate 07/17/22 1139 (!) 122     Resp 07/17/22 1139 18     Temp 07/17/22 1139 98.9 F (37.2 C)     Temp Source 07/17/22 1139 Oral     SpO2 07/17/22 1139 96 %     Weight 07/17/22 1138 160 lb (72.6 kg)     Height 07/17/22 1138 6\' 1"  (1.854 m)     Head Circumference --      Peak Flow --      Pain Score 07/17/22 1137 4     Pain Loc --      Pain Edu? --      Excl. in GC? --    No data found.  Updated Vital Signs BP (!) 137/99 (BP Location: Right Arm)   Pulse (!) 112   Temp 98.9 F (37.2 C) (Oral)   Resp 18   Ht 6\' 1"  (1.854 m)  Wt 160 lb (72.6 kg)   SpO2 96%   BMI 21.11 kg/m   Visual Acuity Right Eye Distance:   Left Eye Distance:   Bilateral Distance:    Right Eye Near:   Left Eye Near:    Bilateral Near:     Physical Exam Constitutional:      General: He is not in acute distress.    Appearance: Normal appearance. He is not toxic-appearing or diaphoretic.  HENT:     Head: Normocephalic and atraumatic.  Eyes:     Extraocular Movements: Extraocular movements intact.     Conjunctiva/sclera: Conjunctivae normal.  Cardiovascular:     Rate and Rhythm: Regular rhythm. Tachycardia present.     Pulses: Normal pulses.     Heart sounds: Normal heart sounds.  Pulmonary:     Effort: Pulmonary effort is normal. No respiratory distress.     Breath sounds: Normal breath sounds. No stridor. No wheezing, rhonchi or rales.  Chest:       Comments: Patient has very mild tenderness to palpation to left lower anterior ribs.  No discoloration, lacerations, abrasions, crepitus noted. Neurological:     General: No focal deficit present.     Mental Status: He is alert  and oriented to person, place, and time. Mental status is at baseline.     Cranial Nerves: Cranial nerves 2-12 are intact.     Sensory: Sensation is intact.     Motor: Motor function is intact.     Coordination: Coordination is intact.     Gait: Gait is intact.  Psychiatric:        Mood and Affect: Mood normal.        Behavior: Behavior normal.        Thought Content: Thought content normal.        Judgment: Judgment normal.      UC Treatments / Results  Labs (all labs ordered are listed, but only abnormal results are displayed) Labs Reviewed - No data to display  EKG   Radiology No results found.  Procedures Procedures (including critical care time)  Medications Ordered in UC Medications - No data to display  Initial Impression / Assessment and Plan / UC Course  I have reviewed the triage vital signs and the nursing notes.  Pertinent labs & imaging results that were available during my care of the patient were reviewed by me and considered in my medical decision making (see chart for details).     EKG showing tachycardia with ST depression in various leads including V4, V5, II, III.  Given EKG findings and previous syncopal episode with associated tachycardia, recommended to patient that he go to the emergency department today for further evaluation and management.  Chest pain is most likely musculoskeletal in etiology but other workup is necessary for syncopal episode which cannot be provided here at urgent care.  Patient was agreeable to this plan but declined EMS transport.  Patient wished for his girlfriend to transport him to the emergency department.  Advised him to go straight to the emergency department and he voiced understanding of this. Final Clinical Impressions(s) / UC Diagnoses   Final diagnoses:  Syncope, unspecified syncope type  Chest pain, unspecified type  Fall, initial encounter  Tachycardia   Discharge Instructions   None    ED Prescriptions    None    PDMP not reviewed this encounter.   Gustavus Bryant, [place] 07/17/22 1236

## 2022-08-08 ENCOUNTER — Ambulatory Visit: Payer: Self-pay | Attending: Nurse Practitioner | Admitting: Nurse Practitioner

## 2022-08-08 ENCOUNTER — Encounter: Payer: Self-pay | Admitting: Nurse Practitioner

## 2022-08-08 VITALS — BP 119/84 | HR 120 | Ht 73.0 in | Wt 149.6 lb

## 2022-08-08 DIAGNOSIS — I10 Essential (primary) hypertension: Secondary | ICD-10-CM

## 2022-08-08 DIAGNOSIS — R55 Syncope and collapse: Secondary | ICD-10-CM

## 2022-08-08 DIAGNOSIS — Z7689 Persons encountering health services in other specified circumstances: Secondary | ICD-10-CM

## 2022-08-08 DIAGNOSIS — R7989 Other specified abnormal findings of blood chemistry: Secondary | ICD-10-CM

## 2022-08-08 NOTE — Progress Notes (Signed)
Assessment & Plan:  Jimmy Howard was seen today for medical management of chronic issues.  Diagnoses and all orders for this visit:  Encounter to establish care  Primary hypertension -     amLODipine (NORVASC) 5 MG tablet; Take 1 tablet (5 mg total) by mouth daily.  Syncope, unspecified syncope type -     Ambulatory referral to Cardiology -     ECHOCARDIOGRAM COMPLETE; Future  Elevated LFTs -     CMP14+EGFR    Patient has been counseled on age-appropriate routine health concerns for screening and prevention. These are reviewed and up-to-date. Referrals have been placed accordingly. Immunizations are up-to-date or declined.    Subjective:   Chief Complaint  Patient presents with   Medical Management of Chronic Issues   HPI Jimmy Howard 37 y.o. male presents to office today to establish care.  He has a history of syncope, HTN, Tobacco dependence   Jimmy Howard was seen in the ER on 07-17-2022 after a syncopal episode at work. He was found by his coworker and EMS was called. He works in a [employer]. PER ED REPORT: He was unloading a truck.  He reports he was working pretty briskly.  He reports it was hot in there.  However, he reports he had no symptoms prior to passing out.  He did not feel lightheaded, short of breath, chest pain, headache.  Reportedly his coworker said that he seemed to be choking on some saliva and rolled him onto his side.  The next thing the patient remembers was being escorted to the EMS truck.  Blood pressure was 190 systolic at that time.  Heart rate was elevated.   He reports he has had a little bit of pain on the left side of his chest and a focal area.  He thinks he might of fallen and hit that side of his chest.  Reports is not very bad now.  He reports he also has a small area on the back of his head on the left is a small knot on his left knee.  He reports none of them are very painful and at this point he feels well.  He has not had any ongoing problems  with dizziness, shortness of breath or lightheadedness.  Did go to urgent care this morning for evaluation due to his passing out episode and was recommended to come to the emergency department for abnormal EKG.  Patient denies being aware of a history of hypertension.  However he has not had his blood pressure checked in maybe 5 years.   Patient reports he does smoke about 1 pack of cigarettes daily or every 2 days.  He smokes marijuana occasionally.  He reports occasional alcohol use.   EKG Showed Prolonged QT with ST depression with LVH. With rehydration and electrolyte replacement patient's heart rate has come down into the 70s and 80s.  Repeat EKG continues to show QT prolongation, normal sinus rhythm.     Today potassium is still low. Will replete with KCL tablets. Recheck labs in a few weeks. We were unable to perform another EKG today due to diaphoresis from anxiety being in a medical office. Electrodes were very difficult to place. He does not endorse any cardiac chest pain today. Due to ECHO results, tachycardia today and previous abnormal EKG with syncope of unknown etiology we will refer to cardiology. Blood pressure is well controlled with amlodipine 5 mg daily.  BP Readings from Last 3 Encounters:  08/08/22 119/84  07/17/22 Marland Kitchen)  177/114  07/17/22 (!) 137/99      Review of Systems  Constitutional:  Positive for diaphoresis. Negative for fever, malaise/fatigue and weight loss.  HENT: Negative.  Negative for nosebleeds.   Eyes: Negative.  Negative for blurred vision, double vision and photophobia.  Respiratory: Negative.  Negative for cough and shortness of breath.   Cardiovascular: Negative.  Negative for chest pain, palpitations and leg swelling.  Gastrointestinal: Negative.  Negative for heartburn, nausea and vomiting.  Musculoskeletal: Negative.  Negative for myalgias.  Neurological: Negative.  Negative for dizziness, focal weakness, seizures and headaches.   Psychiatric/Behavioral: Negative.  Negative for suicidal ideas.     Past Medical History:  Diagnosis Date   Hypertension     History reviewed. No pertinent surgical history.  Family History  Family history unknown: Yes    Social History Reviewed with no changes to be made today.   Outpatient Medications Prior to Visit  Medication Sig Dispense Refill   amLODipine (NORVASC) 5 MG tablet Take 1 tablet (5 mg total) by mouth daily. 30 tablet 1   doxycycline (VIBRAMYCIN) 100 MG capsule Take 1 capsule (100 mg total) by mouth 2 (two) times daily. 20 capsule 0   Magnesium 200 MG TABS Take 1 tablet (200 mg total) by mouth daily. 15 tablet 0   potassium chloride SA (KLOR-CON M) 20 MEQ tablet Take 1 tablet (20 mEq total) by mouth 2 (two) times daily. 20 tablet 0   triamcinolone cream (KENALOG) 0.1 % Apply 1 Application topically 2 (two) times daily. 30 g 0   No facility-administered medications prior to visit.    No Known Allergies     Objective:    BP 119/84 (BP Location: Left Arm, Patient Position: Sitting, Cuff Size: Normal)   Pulse (!) 120   Ht 6\' 1"  (1.854 m)   Wt 149 lb 9.6 oz (67.9 kg)   SpO2 99%   BMI 19.74 kg/m  Wt Readings from Last 3 Encounters:  08/08/22 149 lb 9.6 oz (67.9 kg)  07/17/22 160 lb 0.9 oz (72.6 kg)  07/17/22 160 lb (72.6 kg)    Physical Exam Vitals and nursing note reviewed.  Constitutional:      Appearance: He is well-developed.  HENT:     Head: Normocephalic and atraumatic.  Cardiovascular:     Rate and Rhythm: Regular rhythm. Tachycardia present.     Heart sounds: Normal heart sounds. No murmur heard.    No friction rub. No gallop.  Pulmonary:     Effort: Pulmonary effort is normal. No tachypnea or respiratory distress.     Breath sounds: Normal breath sounds. No decreased breath sounds, wheezing, rhonchi or rales.  Chest:     Chest wall: No tenderness.  Abdominal:     General: Bowel sounds are normal.     Palpations: Abdomen is soft.   Musculoskeletal:        General: Normal range of motion.     Cervical back: Normal range of motion.  Skin:    General: Skin is warm and dry.  Neurological:     Mental Status: He is alert and oriented to person, place, and time.     Coordination: Coordination normal.  Psychiatric:        Behavior: Behavior normal. Behavior is cooperative.        Thought Content: Thought content normal.        Judgment: Judgment normal.          Patient has been counseled extensively about nutrition and  exercise as well as the importance of adherence with medications and regular follow-up. The patient was given clear instructions to go to ER or return to medical center if symptoms don't improve, worsen or new problems develop. The patient verbalized understanding.   Follow-up: Return in about 3 months (around 11/08/2022).   Claiborne Rigg, FNP-BC [facility] and [facility] [place], [place] [phone]   08/15/2022, 9:30 PM

## 2022-08-12 ENCOUNTER — Other Ambulatory Visit: Payer: Self-pay | Admitting: Nurse Practitioner

## 2022-08-12 DIAGNOSIS — R748 Abnormal levels of other serum enzymes: Secondary | ICD-10-CM

## 2022-08-12 DIAGNOSIS — E876 Hypokalemia: Secondary | ICD-10-CM

## 2022-08-12 MED ORDER — POTASSIUM CHLORIDE CRYS ER 20 MEQ PO TBCR
20.0000 meq | EXTENDED_RELEASE_TABLET | Freq: Two times a day (BID) | ORAL | 0 refills | Status: DC
Start: 2022-08-12 — End: 2023-01-07

## 2022-08-13 ENCOUNTER — Telehealth: Payer: Self-pay | Admitting: *Deleted

## 2022-08-13 NOTE — Telephone Encounter (Signed)
  Chief Complaint: NA Symptoms: NA Frequency: NA Pertinent Negatives: Patient denies NA Disposition: [] ED /[] Urgent Care (no appt availability in office) / [] Appointment(In office/virtual)/ []   Virtual Care/ [] Home Care/ [] Refused Recommended Disposition /[] [facility]/ []  Follow-up with PCP Additional Notes:  Pt called back for lab results, reviewed, pt verbalizes understanding. Pt states has picked up K+ but has not started taking as of yet. Advised to do so as ordered by PCP.  Lab appt secured for 09/05/22, advised fasting. Pt verbalizes understanding.   We will need to recheck labs in 2-3 weeks.   Kidney function improved but still not normal. Recommendations: low salt diet. Drink at least 64-80oz water per day. Take blood pressure medication every day no matter if you feel good or not. Poorly controlled blood pressure can lead to kidney disease.   Potassium is low. I am sending a weeks worth of potassium to pharmacy. Will repeat at lab visit in 2-3 weeks. Needs more potassium foods: bananas, oranges, orange juice, broccoli, yogurt, citrus fruits.   Liver enzymes elevated. If you drink no more than 1 drink a day. Avoid lots of fried foods or unhealthy snacking. Make sure you are fasting nothing to eat prior to next lab draw in a few weeks.

## 2022-08-15 ENCOUNTER — Encounter: Payer: Self-pay | Admitting: Nurse Practitioner

## 2022-08-15 ENCOUNTER — Ambulatory Visit (HOSPITAL_COMMUNITY)
Admission: RE | Admit: 2022-08-15 | Discharge: 2022-08-15 | Disposition: A | Discharge status: Home / Self Care | Payer: Self-pay | Source: Ambulatory Visit | Attending: Nurse Practitioner | Admitting: Nurse Practitioner

## 2022-08-15 DIAGNOSIS — R55 Syncope and collapse: Secondary | ICD-10-CM | POA: Insufficient documentation

## 2022-08-15 DIAGNOSIS — I10 Essential (primary) hypertension: Secondary | ICD-10-CM | POA: Insufficient documentation

## 2022-08-15 DIAGNOSIS — I517 Cardiomegaly: Secondary | ICD-10-CM | POA: Insufficient documentation

## 2022-08-15 DIAGNOSIS — F17200 Nicotine dependence, unspecified, uncomplicated: Secondary | ICD-10-CM | POA: Insufficient documentation

## 2022-08-15 LAB — ECHOCARDIOGRAM COMPLETE
Area-P 1/2: 2.7 cm2
Calc EF: 45 %
S' Lateral: 3.6 cm
Single Plane A2C EF: 49.5 %
Single Plane A4C EF: 49.9 %

## 2022-08-15 MED ORDER — AMLODIPINE BESYLATE 5 MG PO TABS
5.0000 mg | ORAL_TABLET | Freq: Every day | ORAL | 3 refills | Status: DC
Start: 2022-08-15 — End: 2023-06-12

## 2022-08-15 NOTE — Progress Notes (Signed)
  Echocardiogram 2D Echocardiogram has been performed.  Jimmy Howard 08/15/2022, 11:17 AM

## 2022-09-05 ENCOUNTER — Ambulatory Visit: Payer: Self-pay | Attending: Nurse Practitioner

## 2022-09-05 DIAGNOSIS — R748 Abnormal levels of other serum enzymes: Secondary | ICD-10-CM

## 2022-09-05 DIAGNOSIS — E876 Hypokalemia: Secondary | ICD-10-CM

## 2022-09-06 LAB — CMP14+EGFR
ALT: 7 IU/L (ref 0–44)
AST: 60 IU/L — ABNORMAL HIGH (ref 0–40)
Albumin: 5.6 g/dL — ABNORMAL HIGH (ref 4.1–5.1)
Alkaline Phosphatase: 125 IU/L — ABNORMAL HIGH (ref 44–121)
BUN/Creatinine Ratio: 21 — ABNORMAL HIGH (ref 9–20)
BUN: 26 mg/dL — ABNORMAL HIGH (ref 6–20)
Bilirubin Total: 0.5 mg/dL (ref 0.0–1.2)
CO2: 18 mmol/L — ABNORMAL LOW (ref 20–29)
Calcium: 9 mg/dL (ref 8.7–10.2)
Chloride: 95 mmol/L — ABNORMAL LOW (ref 96–106)
Creatinine, Ser: 1.21 mg/dL (ref 0.76–1.27)
Globulin, Total: 3.1 g/dL (ref 1.5–4.5)
Glucose: 101 mg/dL — ABNORMAL HIGH (ref 70–99)
Potassium: 3.4 mmol/L — ABNORMAL LOW (ref 3.5–5.2)
Sodium: 134 mmol/L (ref 134–144)
Total Protein: 8.7 g/dL — ABNORMAL HIGH (ref 6.0–8.5)
eGFR: 78 mL/min/{1.73_m2} (ref >59)

## 2022-11-14 ENCOUNTER — Ambulatory Visit: Payer: Self-pay | Admitting: Nurse Practitioner

## 2022-11-28 ENCOUNTER — Encounter: Payer: Self-pay | Admitting: Nurse Practitioner

## 2022-11-28 ENCOUNTER — Ambulatory Visit: Payer: Self-pay | Attending: Nurse Practitioner | Admitting: Nurse Practitioner

## 2022-11-28 VITALS — BP 171/120 | HR 94 | Ht 73.0 in | Wt 155.4 lb

## 2022-11-28 DIAGNOSIS — H6121 Impacted cerumen, right ear: Secondary | ICD-10-CM

## 2022-11-28 DIAGNOSIS — I10 Essential (primary) hypertension: Secondary | ICD-10-CM

## 2022-11-28 MED ORDER — CLONIDINE HCL 0.2 MG PO TABS
0.2000 mg | ORAL_TABLET | Freq: Once | ORAL | Status: AC
Start: 2022-11-28 — End: 2022-11-28
  Administered 2022-11-28: 0.2 mg via ORAL

## 2022-11-28 NOTE — Progress Notes (Signed)
Assessment & Plan:  Jimmy Howard was seen today for medical management of chronic issues and ear fullness.  Diagnoses and all orders for this visit:  Primary hypertension -     cloNIDine (CATAPRES) tablet 0.2 mg -     CMP14+EGFR Continue amlodipine as prescribed.  Reminded to bring in blood pressure log for follow  up appointment.  RECOMMENDATIONS: DASH/Mediterranean Diets are healthier choices for HTN.    Hearing loss of right ear due to cerumen impaction Improved symptoms after irrigation   Patient has been counseled on age-appropriate routine health concerns for screening and prevention. These are reviewed and up-to-date. Referrals have been placed accordingly. Immunizations are up-to-date or declined.    Subjective:   Chief Complaint  Patient presents with   Medical Management of Chronic Issues   Ear Fullness    Right ear discomfort and ringing.    Jimmy Howard 37 y.o. male presents to office today for follow up to HTN. He also notes pressure/discomfort in right ear. Denies any URI symptoms.     Blood pressure is significantly elevated today.  He required clonidine 0.2 mg in office.  He does report not consistently taking his amlodipine daily as prescribed.  Did denies any chest pain or shortness of breath at this time. BP Readings from Last 3 Encounters:  11/28/22 (!) 171/120  08/08/22 119/84  07/17/22 (!) 177/114    He has significant cerumen impaction of the right ear which was  gently irrigated in office today.  He did note improvement of symptoms after irrigation.    Review of Systems  Constitutional:  Negative for fever, malaise/fatigue and weight loss.  HENT:  Positive for hearing loss and tinnitus. Negative for nosebleeds.   Eyes: Negative.  Negative for blurred vision, double vision and photophobia.  Respiratory: Negative.  Negative for cough and shortness of breath.   Cardiovascular: Negative.  Negative for chest pain, palpitations and leg swelling.   Gastrointestinal: Negative.  Negative for heartburn, nausea and vomiting.  Musculoskeletal: Negative.  Negative for myalgias.  Neurological: Negative.  Negative for dizziness, focal weakness, seizures and headaches.  Psychiatric/Behavioral: Negative.  Negative for suicidal ideas.     Past Medical History:  Diagnosis Date   Hypertension     History reviewed. No pertinent surgical history.  Family History  Family history unknown: Yes    Social History Reviewed with no changes to be made today.   Outpatient Medications Prior to Visit  Medication Sig Dispense Refill   potassium chloride SA (KLOR-CON M) 20 MEQ tablet Take 1 tablet (20 mEq total) by mouth 2 (two) times daily for 7 days. (Patient not taking: Reported on 11/28/2022) 7 tablet 0   amLODipine (NORVASC) 5 MG tablet Take 1 tablet (5 mg total) by mouth daily. (Patient not taking: Reported on 11/28/2022) 90 tablet 3   No facility-administered medications prior to visit.    No Known Allergies     Objective:    BP (!) 171/120   Pulse 94   Ht 6\' 1"  (1.854 m)   Wt 155 lb 6.4 oz (70.5 kg)   SpO2 100%   BMI 20.50 kg/m  Wt Readings from Last 3 Encounters:  11/28/22 155 lb 6.4 oz (70.5 kg)  08/08/22 149 lb 9.6 oz (67.9 kg)  07/17/22 160 lb 0.9 oz (72.6 kg)    Physical Exam Vitals and nursing note reviewed.  Constitutional:      Appearance: He is well-developed.  HENT:     Head: Normocephalic and  atraumatic.     Right Ear: There is impacted cerumen.     Left Ear: Hearing, tympanic membrane, ear canal and external ear normal.  Cardiovascular:     Rate and Rhythm: Normal rate and regular rhythm.     Heart sounds: Normal heart sounds. No murmur heard.    No friction rub. No gallop.  Pulmonary:     Effort: Pulmonary effort is normal. No tachypnea or respiratory distress.     Breath sounds: Normal breath sounds. No decreased breath sounds, wheezing, rhonchi or rales.  Chest:     Chest wall: No tenderness.   Abdominal:     General: Bowel sounds are normal.     Palpations: Abdomen is soft.  Musculoskeletal:        General: Normal range of motion.     Cervical back: Normal range of motion.  Skin:    General: Skin is warm and dry.  Neurological:     Mental Status: He is alert and oriented to person, place, and time.     Coordination: Coordination normal.  Psychiatric:        Behavior: Behavior normal. Behavior is cooperative.        Thought Content: Thought content normal.        Judgment: Judgment normal.          Patient has been counseled extensively about nutrition and exercise as well as the importance of adherence with medications and regular follow-up. The patient was given clear instructions to go to ER or return to medical center if symptoms don't improve, worsen or new problems develop. The patient verbalized understanding.   Follow-up: Return in about 3 months (around 02/28/2023).   Claiborne Rigg, FNP-BC [facility] [place], [place] [phone]   11/28/2022, 2:32 PM

## 2022-11-29 ENCOUNTER — Ambulatory Visit: Payer: Self-pay | Admitting: Physician Assistant

## 2022-11-29 LAB — CMP14+EGFR
ALT: 15 [IU]/L (ref 0–44)
AST: 180 [IU]/L — ABNORMAL HIGH (ref 0–40)
Albumin: 5.3 g/dL — ABNORMAL HIGH (ref 4.1–5.1)
Alkaline Phosphatase: 157 [IU]/L — ABNORMAL HIGH (ref 44–121)
BUN/Creatinine Ratio: 7 — ABNORMAL LOW (ref 9–20)
BUN: 6 mg/dL (ref 6–20)
Bilirubin Total: 0.5 mg/dL (ref 0.0–1.2)
CO2: 23 mmol/L (ref 20–29)
Calcium: 8.4 mg/dL — ABNORMAL LOW (ref 8.7–10.2)
Chloride: 89 mmol/L — ABNORMAL LOW (ref 96–106)
Creatinine, Ser: 0.89 mg/dL (ref 0.76–1.27)
Globulin, Total: 3.3 g/dL (ref 1.5–4.5)
Glucose: 92 mg/dL (ref 70–99)
Potassium: 3.6 mmol/L (ref 3.5–5.2)
Sodium: 137 mmol/L (ref 134–144)
Total Protein: 8.6 g/dL — ABNORMAL HIGH (ref 6.0–8.5)
eGFR: 112 mL/min/{1.73_m2} (ref >59)

## 2022-12-02 ENCOUNTER — Other Ambulatory Visit: Payer: Self-pay | Admitting: Nurse Practitioner

## 2022-12-02 DIAGNOSIS — R748 Abnormal levels of other serum enzymes: Secondary | ICD-10-CM

## 2022-12-03 ENCOUNTER — Telehealth: Payer: Self-pay

## 2022-12-03 NOTE — Telephone Encounter (Signed)
Pt given lab results per notes of Zelda on 12/02/22. Pt verbalized understanding. Patient has been scheduled for labs 01/16/23  Claiborne Rigg, NP 12/02/2022 10:54 PM EST Back to Top    We need to order [place] of abdomen and repeat labs fasting in 6 weeks.     Chloride levels are very low. f you drink alcohol try not to consume more than 1 drink a night. It could also come from excessive sweating and not drinking enough water. Also liver enzymes elevated. Try to not eat a lot of fried foods or a lot of beef every week.

## 2022-12-10 ENCOUNTER — Ambulatory Visit (HOSPITAL_COMMUNITY): Payer: Self-pay

## 2022-12-12 ENCOUNTER — Ambulatory Visit (HOSPITAL_COMMUNITY): Admission: RE | Admit: 2022-12-12 | Payer: Self-pay | Source: Ambulatory Visit

## 2023-01-07 ENCOUNTER — Encounter (HOSPITAL_COMMUNITY): Payer: Self-pay

## 2023-01-07 ENCOUNTER — Emergency Department (HOSPITAL_COMMUNITY): Payer: Self-pay

## 2023-01-07 ENCOUNTER — Other Ambulatory Visit: Payer: Self-pay

## 2023-01-07 ENCOUNTER — Emergency Department (HOSPITAL_COMMUNITY)
Admission: EM | Admit: 2023-01-07 | Discharge: 2023-01-07 | Disposition: A | Discharge status: Home / Self Care | Payer: Self-pay | Attending: Emergency Medicine | Admitting: Emergency Medicine

## 2023-01-07 DIAGNOSIS — I10 Essential (primary) hypertension: Secondary | ICD-10-CM | POA: Insufficient documentation

## 2023-01-07 DIAGNOSIS — R569 Unspecified convulsions: Secondary | ICD-10-CM | POA: Insufficient documentation

## 2023-01-07 DIAGNOSIS — R7989 Other specified abnormal findings of blood chemistry: Secondary | ICD-10-CM

## 2023-01-07 DIAGNOSIS — Z79899 Other long term (current) drug therapy: Secondary | ICD-10-CM | POA: Insufficient documentation

## 2023-01-07 DIAGNOSIS — R778 Other specified abnormalities of plasma proteins: Secondary | ICD-10-CM | POA: Insufficient documentation

## 2023-01-07 DIAGNOSIS — E876 Hypokalemia: Secondary | ICD-10-CM | POA: Insufficient documentation

## 2023-01-07 LAB — COMPREHENSIVE METABOLIC PANEL
ALT: 11 U/L (ref 0–44)
AST: 78 U/L — ABNORMAL HIGH (ref 15–41)
Albumin: 4.8 g/dL (ref 3.5–5.0)
Alkaline Phosphatase: 89 U/L (ref 38–126)
Anion gap: 19 — ABNORMAL HIGH (ref 5–15)
BUN: 5 mg/dL — ABNORMAL LOW (ref 6–20)
CO2: 23 mmol/L (ref 22–32)
Calcium: 8.5 mg/dL — ABNORMAL LOW (ref 8.9–10.3)
Chloride: 92 mmol/L — ABNORMAL LOW (ref 98–111)
Creatinine, Ser: 0.87 mg/dL (ref 0.61–1.24)
GFR, Estimated: >60 mL/min (ref >60)
Glucose, Bld: 94 mg/dL (ref 70–99)
Potassium: 2.7 mmol/L — CL (ref 3.5–5.1)
Sodium: 134 mmol/L — ABNORMAL LOW (ref 135–145)
Total Bilirubin: 0.8 mg/dL (ref 0.0–1.2)
Total Protein: 8.6 g/dL — ABNORMAL HIGH (ref 6.5–8.1)

## 2023-01-07 LAB — URINALYSIS, ROUTINE W REFLEX MICROSCOPIC
Bacteria, UA: NONE SEEN
Bilirubin Urine: NEGATIVE
Glucose, UA: 50 mg/dL — AB
Ketones, ur: NEGATIVE mg/dL
Leukocytes,Ua: NEGATIVE
Nitrite: NEGATIVE
Protein, ur: >=300 mg/dL — AB
Specific Gravity, Urine: 1.013 (ref 1.005–1.030)
pH: 6 (ref 5.0–8.0)

## 2023-01-07 LAB — RAPID URINE DRUG SCREEN, HOSP PERFORMED
Amphetamines: NOT DETECTED
Barbiturates: NOT DETECTED
Benzodiazepines: NOT DETECTED
Cocaine: NOT DETECTED
Opiates: NOT DETECTED
Tetrahydrocannabinol: POSITIVE — AB

## 2023-01-07 LAB — CBC WITH DIFFERENTIAL/PLATELET
Abs Immature Granulocytes: 0 10*3/uL (ref 0.00–0.07)
Basophils Absolute: 0 10*3/uL (ref 0.0–0.1)
Basophils Relative: 0 %
Eosinophils Absolute: 0.1 10*3/uL (ref 0.0–0.5)
Eosinophils Relative: 1 %
HCT: 29 % — ABNORMAL LOW (ref 39.0–52.0)
Hemoglobin: 9.9 g/dL — ABNORMAL LOW (ref 13.0–17.0)
Lymphocytes Relative: 12 %
Lymphs Abs: 1.1 10*3/uL (ref 0.7–4.0)
MCH: 28.3 pg (ref 26.0–34.0)
MCHC: 34.1 g/dL (ref 30.0–36.0)
MCV: 82.9 fL (ref 80.0–100.0)
Monocytes Absolute: 0.4 10*3/uL (ref 0.1–1.0)
Monocytes Relative: 4 %
Neutro Abs: 7.9 10*3/uL — ABNORMAL HIGH (ref 1.7–7.7)
Neutrophils Relative %: 83 %
Platelets: 222 10*3/uL (ref 150–400)
RBC: 3.5 MIL/uL — ABNORMAL LOW (ref 4.22–5.81)
RDW: 23.7 % — ABNORMAL HIGH (ref 11.5–15.5)
WBC: 9.5 10*3/uL (ref 4.0–10.5)
nRBC: 0.3 % — ABNORMAL HIGH (ref 0.0–0.2)
nRBC: 1 /100{WBCs} — ABNORMAL HIGH

## 2023-01-07 LAB — TROPONIN I (HIGH SENSITIVITY)
Troponin I (High Sensitivity): 16 ng/L (ref <18)
Troponin I (High Sensitivity): 21 ng/L — ABNORMAL HIGH (ref <18)

## 2023-01-07 LAB — MAGNESIUM: Magnesium: 0.9 mg/dL — CL (ref 1.7–2.4)

## 2023-01-07 MED ORDER — POTASSIUM CHLORIDE 10 MEQ/100ML IV SOLN
10.0000 meq | INTRAVENOUS | Status: DC
  Administered 2023-01-07: 10 meq via INTRAVENOUS
  Filled 2023-01-07 (×2): qty 100

## 2023-01-07 MED ORDER — POTASSIUM CHLORIDE CRYS ER 20 MEQ PO TBCR: 40.0000 meq | EXTENDED_RELEASE_TABLET | Freq: Two times a day (BID) | ORAL | 0 refills | Status: DC

## 2023-01-07 MED ORDER — SODIUM CHLORIDE 0.9 % IV BOLUS
500.0000 mL | Freq: Once | INTRAVENOUS | Status: AC
  Administered 2023-01-07: 500 mL via INTRAVENOUS

## 2023-01-07 MED ORDER — POTASSIUM CHLORIDE CRYS ER 20 MEQ PO TBCR
40.0000 meq | EXTENDED_RELEASE_TABLET | Freq: Once | ORAL | Status: AC
Start: 2023-01-07 — End: 2023-01-07
  Administered 2023-01-07: 40 meq via ORAL
  Filled 2023-01-07: qty 2

## 2023-01-07 MED ORDER — LABETALOL HCL 5 MG/ML IV SOLN
10.0000 mg | Freq: Once | INTRAVENOUS | Status: AC
  Administered 2023-01-07: 10 mg via INTRAVENOUS
  Filled 2023-01-07: qty 4

## 2023-01-07 MED ORDER — AMLODIPINE BESYLATE 5 MG PO TABS
5.0000 mg | ORAL_TABLET | Freq: Once | ORAL | Status: AC
  Administered 2023-01-07: 5 mg via ORAL
  Filled 2023-01-07: qty 1

## 2023-01-07 MED ORDER — LEVETIRACETAM IN NACL 1000 MG/100ML IV SOLN
1000.0000 mg | Freq: Once | INTRAVENOUS | Status: AC
  Administered 2023-01-07: 1000 mg via INTRAVENOUS
  Filled 2023-01-07: qty 100

## 2023-01-07 MED ORDER — MAGNESIUM SULFATE 2 GM/50ML IV SOLN
2.0000 g | Freq: Once | INTRAVENOUS | Status: AC
  Administered 2023-01-07: 2 g via INTRAVENOUS
  Filled 2023-01-07: qty 50

## 2023-01-07 NOTE — Discharge Instructions (Addendum)
Do not drive or swim or take a bath alone until cleared by neurology. If you have worsening symptoms return to the ED. Please take your amlodipine as instructed and follow-up with your PCP for blood pressure management. Repeat labs with your PCP in 1 week to make sure your electrolytes are improving.

## 2023-01-07 NOTE — ED Provider Notes (Signed)
[place] EMERGENCY DEPARTMENT AT [facility] ([facility]) Provider Note   CSN: [id number] Arrival date & time: 01/07/23  1709     History  Chief Complaint  Patient presents with   Seizures    Jimmy Howard is a 37 y.o. male, hx of HTN, who presents to the ED 2/2 to possible seizure. Notes that he was at work today and woke up and was told by work nurse he likely had seizure. Per family member, patient has had 3 seizure like episodes since November and is not on any medication. States his entire body shakes during these episodes and they last 5-10 minutes and he feels confused afterwards. She notes high blood pressure is often a provoking factor for these episodes. He has not been taking his BP meds for the last 3 days and complained of "just being tired" today. Not on any blood thinners. Patient denies pain, states unknown if hit head.     Home Medications Prior to Admission medications   Medication Sig Start Date End Date Taking? Authorizing Provider  amLODipine (NORVASC) 5 MG tablet Take 1 tablet (5 mg total) by mouth daily. 08/15/22  Yes Claiborne Rigg, NP  potassium chloride SA (KLOR-CON M) 20 MEQ tablet Take 2 tablets (40 mEq total) by mouth 2 (two) times daily for 4 days. 01/07/23 01/11/23 Yes Sandia Pfund L, PA      Allergies    Patient has no known allergies.    Review of Systems   Review of Systems  Constitutional:  Negative for fever.  Neurological:  Positive for seizures.    Physical Exam Updated Vital Signs BP (!) 155/111   Pulse 94   Temp 98.1 F (36.7 C) (Oral)   Resp 18   Ht 6\' 1"  (1.854 m)   Wt 70 kg   SpO2 100%   BMI 20.36 kg/m  Physical Exam Vitals and nursing note reviewed.  Constitutional:      General: He is not in acute distress.    Appearance: He is well-developed.  HENT:     Head: Normocephalic and atraumatic.  Eyes:     Conjunctiva/sclera: Conjunctivae normal.  Cardiovascular:     Rate and Rhythm: Normal rate and regular rhythm.      Heart sounds: No murmur heard. Pulmonary:     Effort: Pulmonary effort is normal. No respiratory distress.     Breath sounds: Normal breath sounds.  Abdominal:     Palpations: Abdomen is soft.     Tenderness: There is no abdominal tenderness.  Musculoskeletal:        General: No swelling.     Cervical back: Neck supple.  Skin:    General: Skin is warm and dry.     Capillary Refill: Capillary refill takes less than 2 seconds.  Neurological:     General: No focal deficit present.     Mental Status: He is alert and oriented to person, place, and time.     Cranial Nerves: No cranial nerve deficit.     Sensory: No sensory deficit.     Motor: No weakness.     Coordination: Coordination normal.  Psychiatric:        Mood and Affect: Mood normal.     ED Results / Procedures / Treatments   Labs (all labs ordered are listed, but only abnormal results are displayed) Labs Reviewed  CBC WITH DIFFERENTIAL/PLATELET - Abnormal; Notable for the following components:      Result Value   RBC 3.50 (*)  Hemoglobin 9.9 (*)    HCT 29.0 (*)    RDW 23.7 (*)    nRBC 0.3 (*)    Neutro Abs 7.9 (*)    nRBC 1 (*)    All other components within normal limits  COMPREHENSIVE METABOLIC PANEL - Abnormal; Notable for the following components:   Sodium 134 (*)    Potassium 2.7 (*)    Chloride 92 (*)    BUN 5 (*)    Calcium 8.5 (*)    Total Protein 8.6 (*)    AST 78 (*)    Anion gap 19 (*)    All other components within normal limits  URINALYSIS, ROUTINE W REFLEX MICROSCOPIC - Abnormal; Notable for the following components:   Glucose, UA 50 (*)    Hgb urine dipstick MODERATE (*)    Protein, ur >=300 (*)    All other components within normal limits  RAPID URINE DRUG SCREEN, HOSP PERFORMED - Abnormal; Notable for the following components:   Tetrahydrocannabinol POSITIVE (*)    All other components within normal limits  MAGNESIUM - Abnormal; Notable for the following components:   Magnesium 0.9 (*)     All other components within normal limits  TROPONIN I (HIGH SENSITIVITY) - Abnormal; Notable for the following components:   Troponin I (High Sensitivity) 21 (*)    All other components within normal limits  TROPONIN I (HIGH SENSITIVITY)    EKG None  Radiology No results found.  Procedures Procedures    Medications Ordered in ED Medications  potassium chloride 10 mEq in 100 mL IVPB (10 mEq Intravenous New Bag/Given 01/07/23 2141)  magnesium sulfate IVPB 2 g 50 mL (has no administration in time range)  amLODipine (NORVASC) tablet 5 mg (has no administration in time range)  labetalol (NORMODYNE) injection 10 mg (has no administration in time range)  levETIRAcetam (KEPPRA) IVPB 1000 mg/100 mL premix (0 mg Intravenous Stopped 01/07/23 1905)  labetalol (NORMODYNE) injection 10 mg (10 mg Intravenous Given 01/07/23 1833)  potassium chloride SA (KLOR-CON M) CR tablet 40 mEq (40 mEq Oral Given 01/07/23 2143)  sodium chloride 0.9 % bolus 500 mL (500 mLs Intravenous New Bag/Given 01/07/23 2136)    ED Course/ Medical Decision Making/ A&P                                 Medical Decision Making Patient is a 37 y.o. male, hx of HTN, not compliant with BP meds or oral potassium here for seizure like activity. Family member at bedside states episodes last 5-10 minutes w/all over shaking and some confusion afterwards. Has happened 3x in past 1.5 months. Will obtain head CT, EKG, blood work for further eval. No neuro deficits on exam. Also found to be hypertensive--has not taken BP meds---will give labetalol and home med  Amount and/or Complexity of Data Reviewed Labs: ordered.    Details: Hypokalemia of 2.7, mg of 0.9, mildly bumped troponin of 21 Radiology: ordered. Discussion of management or test interpretation with external provider(s): Patient is feeling better after BP management, believe elevated troponin likely d/t elevated BP. Denies chest pain or SOB. He will need to f/u with his  PCP for further management and I instructed him he will need to take his amlodipine at home. Additionally, unlikely to be seizure given length of time, but will need neuro f/u d/t recurrent episodes. Given keppra in ER and discussed seizure precautions--no driving, bathing alone, swimming alone.  Head CT is pending, Dr. Particia Nearing to f/u on this--appreciate it. Additionally found to be hypokalemic and hypomagnesemia, replenished magnesium, pt refused IV potassium d/t long wait, was given PO potassium and discharged with prescription w/strict return precautions and emphasized risk of not replenishing electrolytes including cardiac arrhythmias.   Risk Prescription drug management.   Final Clinical Impression(s) / ED Diagnoses Final diagnoses:  Seizure-like activity (HCC)  Hypokalemia  Hypomagnesemia  Hypertension, uncontrolled  Elevated troponin    Rx / DC Orders ED Discharge Orders          Ordered    potassium chloride SA (KLOR-CON M) 20 MEQ tablet  2 times daily        01/07/23 2155              Pete Pelt, [place] 01/07/23 2205    Jacalyn Lefevre, MD 01/07/23 2247

## 2023-01-07 NOTE — ED Notes (Addendum)
Pt refused remaining two potassium bags, PA brooke advised.

## 2023-01-07 NOTE — ED Notes (Signed)
 CCMD called.

## 2023-01-07 NOTE — ED Triage Notes (Signed)
Pt present to ED from place of employment d/t seizure activity. Pt states LOC, waking up to employment nurse standing by him. Pt states last seizure activity in November. Pt states to not being prescribed medication for seizure. Pt A&Ox4 at this time.    EMS VS: Bp 204/118 HR 110 O2 sat 97% room air Cbg 196

## 2023-01-08 ENCOUNTER — Other Ambulatory Visit: Payer: Self-pay | Admitting: Nurse Practitioner

## 2023-01-08 DIAGNOSIS — R569 Unspecified convulsions: Secondary | ICD-10-CM

## 2023-01-10 ENCOUNTER — Encounter: Payer: Self-pay | Admitting: Neurology

## 2023-01-10 ENCOUNTER — Telehealth: Payer: Self-pay

## 2023-01-10 NOTE — Telephone Encounter (Signed)
 Error

## 2023-01-16 ENCOUNTER — Other Ambulatory Visit: Payer: Self-pay | Admitting: Nurse Practitioner

## 2023-01-16 ENCOUNTER — Other Ambulatory Visit: Payer: Self-pay

## 2023-01-16 DIAGNOSIS — E875 Hyperkalemia: Secondary | ICD-10-CM

## 2023-01-16 MED ORDER — POTASSIUM CHLORIDE CRYS ER 20 MEQ PO TBCR
EXTENDED_RELEASE_TABLET | ORAL | 2 refills | Status: DC
  Filled 2023-01-16: qty 30, 15d supply, fill #0

## 2023-01-30 ENCOUNTER — Ambulatory Visit: Payer: Self-pay | Admitting: Neurology

## 2023-01-30 ENCOUNTER — Encounter: Payer: Self-pay | Admitting: Neurology

## 2023-02-13 ENCOUNTER — Other Ambulatory Visit: Payer: Self-pay

## 2023-02-13 ENCOUNTER — Ambulatory Visit (INDEPENDENT_AMBULATORY_CARE_PROVIDER_SITE_OTHER): Payer: Self-pay | Admitting: Neurology

## 2023-02-13 ENCOUNTER — Encounter: Payer: Self-pay | Admitting: Neurology

## 2023-02-13 VITALS — BP 146/101 | HR 109 | Ht 73.0 in | Wt 150.4 lb

## 2023-02-13 DIAGNOSIS — R569 Unspecified convulsions: Secondary | ICD-10-CM

## 2023-02-13 MED ORDER — LEVETIRACETAM 500 MG PO TABS
500.0000 mg | ORAL_TABLET | Freq: Two times a day (BID) | ORAL | 11 refills | Status: DC
  Filled 2023-02-13: qty 60, 30d supply, fill #0
  Filled 2023-05-06: qty 60, 30d supply, fill #1

## 2023-02-13 NOTE — Patient Instructions (Addendum)
 Good to meet you.  Schedule 1-hour EEG  2. Start Keppra  (Levetiracetam ) 500mg : take 1 tablet twice a day  3. Follow-up in 3 months, call for any changes   Seizure Precautions: 1. If medication has been prescribed for you to prevent seizures, take it exactly as directed.  Do not stop taking the medicine without talking to your doctor first, even if you have not had a seizure in a long time.   2. Avoid activities in which a seizure would cause danger to yourself or to others.  Don't operate dangerous machinery, swim alone, or climb in high or dangerous places, such as on ladders, roofs, or girders.  Do not drive unless your doctor says you may.  3. If you have any warning that you may have a seizure, lay down in a safe place where you can't hurt yourself.    4.  No driving for 6 months from last seizure, as per [place]  state law.   Please refer to the following link on the [employer] of [employer]'s website for more information: http://www.epilepsyfoundation.org/answerplace/Social/driving/drivingu.cfm   5.  Maintain good sleep hygiene. Avoid alcohol.  6.  Contact your doctor if you have any problems that may be related to the medicine you are taking.  7.  Call 911 and bring the patient back to the ED if:        A.  The seizure lasts longer than 5 minutes.       B.  The patient doesn't awaken shortly after the seizure  C.  The patient has new problems such as difficulty seeing, speaking or moving  D.  The patient was injured during the seizure  E.  The patient has a temperature over 102 F (39C)  F.  The patient vomited and now is having trouble breathing

## 2023-02-13 NOTE — Progress Notes (Signed)
 NEUROLOGY CONSULTATION NOTE  Jimmy Howard MRN: [id number] DOB: [birth year]  Referring provider: Haze Servant, NP Primary care provider: Haze Servant, NP  Reason for consult:  seizure-like activity   Thank you for your kind referral of Jimmy Howard for consultation of the above symptoms. Although his history is well known to you, please allow me to reiterate it for the purpose of our medical record. The patient was accompanied to the clinic by his significant other Jimmy Howard also provides collateral information. Records and images were personally reviewed where available.   HISTORY OF PRESENT ILLNESS: This is a pleasant 38 year old right-handed man with a history of hypertension presenting for evaluation of seizure-like activity. The first episode occurred on 07/17/22, he was at work and had no prior warning, waking up on the ground feeling fine. His manager told him he was shaking and had to turn him on his side. He denied any focal weakness, tongue bite, or incontinence, and felt fine to drive himself home. He went to the ER the next day where BP was 177/114. CK elevated 727, UDS positive for THC. I personally reviewed head CT without contrast, no acute changes. 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 The second episode occurred after Thanksgiving 2024, Jimmy Howard reports he was laying in be then started choking, he was making vocalizations, reaching out, trying to grab at her, trying to get out of bed, unresponsive for 5 minutes. He was confused for 15 minutes after, unable to initially answer EMS questions. He did not want to go to the ER. Jimmy Howard recalls earlier that morning he told her he was feeling weak. 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 No shaking reported. He went to the ER where BP was 155/111, potassium 2.7, UDS positive for THC. Head CT no acute changes.   Jimmy Howard denies any staring/unresponsive episodes. He denies any gaps in time, olfactory/gustatory hallucinations, deja vu, rising epigastric sensation, focal numbness/tingling/weakness, myoclonic jerks. No headaches, dizziness (except when he felt weak), diplopia/dysarthria/dysphagia, neck/back pain, bowel/bladder dysfunction. He gets at least 8 hours of sleep. Mood is fine. Memory is as good as it is going to be. He drinks at least a fifth a week (drinking every other night), no change in intake prior to the episodes. He lives with Jimmy Howard and their 2 teenage sons.  He had a normal birth and early development.  There is no history of febrile convulsions, CNS infections such as meningitis/encephalitis, significant traumatic brain injury, neurosurgical procedures, or family history of seizures.   PAST MEDICAL HISTORY: Past Medical History:  Diagnosis Date   Hypertension     PAST SURGICAL HISTORY: History reviewed. No pertinent surgical history.  MEDICATIONS: Current Outpatient Medications on File Prior to Visit  Medication Sig Dispense Refill   amLODipine  (NORVASC ) 5 MG tablet Take 1 tablet (5 mg total) by mouth daily. 90 tablet 3   potassium chloride  SA (KLOR-CON  M) 20 MEQ tablet Take 2 tablets (40 mEq total) by mouth 2 (two) times daily for 5 days, THEN 1 tablet (20 mEq total) daily. 30 tablet 2   No current facility-administered medications on file prior to visit.    ALLERGIES: No Known Allergies  FAMILY HISTORY: Family History  Family history unknown:  Yes    SOCIAL HISTORY: Social History   Socioeconomic History   Marital status: Single    Spouse name: Not on file   Number of children: Not on file   Years of education: Not on file   Highest education level: 12th grade  Occupational History   Not on file  Tobacco Use   Smoking  status: Every Day    Types: Cigarettes   Smokeless tobacco: Never  Vaping Use   Vaping status: Never Used  Substance and Sexual Activity   Alcohol use: No   Drug use: No   Sexual activity: Not Currently  Other Topics Concern   Not on file  Social History Narrative   Are you right handed or left handed? Right    Are you currently employed ?  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 No    Howard lives with you? Family    What type of home do you live in: 1 story or 2 story?  2 story        Social Drivers of Corporate Investment Banker Strain: Low Risk  (11/11/2022)   Overall Financial Resource Strain (CARDIA)    Difficulty of Paying Living Expenses: Not hard at all  Food Insecurity: No Food Insecurity (11/11/2022)   Hunger Vital Sign    Worried About Running Out of Food in the Last Year: Never true    Ran Out of Food in the Last Year: Never true  Transportation Needs: No Transportation Needs (11/11/2022)   PRAPARE - Administrator, Civil Service (Medical): No    Lack of Transportation (Non-Medical): No  Physical Activity: Sufficiently Active (11/11/2022)   Exercise Vital Sign    Days of Exercise per Week: 7 days    Minutes of Exercise per Session: 30 min  Stress: No Stress Concern Present (11/11/2022)   [employer] of Occupational Health - Occupational Stress Questionnaire    Feeling of Stress : Not at all  Social Connections: Moderately Isolated (11/11/2022)   Social Connection and Isolation Panel [NHANES]    Frequency of Communication with Friends and Family: Three times a week    Frequency of Social Gatherings with Friends and Family: Twice a week    Attends Religious Services: Never    Database Administrator or Organizations: No    Attends Engineer, Structural: Not on file    Marital Status: Living with partner  Intimate Partner Violence: Not At Risk (11/28/2022)   Humiliation, Afraid, Rape, and  Kick questionnaire    Fear of Current or Ex-Partner: No    Emotionally Abused: No    Physically Abused: No    Sexually Abused: No     PHYSICAL EXAM: Vitals:   02/13/23 1252 02/13/23 1301  BP: (!) 143/102 (!) 146/101  Pulse: (!) 109   SpO2: 99%    General: No acute distress Head:  Normocephalic/atraumatic Skin/Extremities: No rash, no edema Neurological Exam: Mental status: alert and oriented to person, place, and time, no dysarthria or aphasia, Fund of knowledge is appropriate.  Recent and remote memory are intact, 2/3 delayed recall.  Attention and concentration are normal, 3/5 WORLD backwards.  Cranial nerves: CN I: not tested CN II: pupils equal, round, visual fields intact CN III, IV, VI:  full range of motion, no nystagmus, no ptosis CN V: facial sensation intact CN VII: upper and lower face symmetric CN VIII:  hearing intact to conversation Bulk & Tone: normal, no fasciculations. Motor: 5/5 throughout with no pronator drift. Sensation: intact to light touch, cold, pin, vibration sense.  No extinction to double simultaneous stimulation.  Romberg test negative Deep Tendon Reflexes: +1 both UE, +2 both LE Cerebellar: no incoordination on finger to nose testing Gait: narrow-based and steady, able to tandem walk adequately. Tremor: none   IMPRESSION: This is a 38 year old right-handed man with a history of hypertension with recurrent episodes of loss of consciousness, witnessed episode suggestive of focal seizure. Head CT x 2 no acute changes. We discussed doing a 1-hour EEG. Discussed recommendation to start seizure medication, side effects of Levetiracetam  500mg  BID were discussed. We discussed avoidance of seizure triggers, including gradual alcohol reduction/cessation. [place] driving laws were discussed with the patient, and he knows to stop driving after a seizure, until 6 months seizure-free. Follow-up in 3 months, call for any changes.   Thank you for allowing me to  participate in the care of this patient. Please do not hesitate to call for any questions or concerns.   Jimmy Howard, M.D.  CC: Haze Servant, NP

## 2023-02-14 ENCOUNTER — Other Ambulatory Visit: Payer: Self-pay

## 2023-02-27 ENCOUNTER — Ambulatory Visit (INDEPENDENT_AMBULATORY_CARE_PROVIDER_SITE_OTHER): Payer: Self-pay | Admitting: Neurology

## 2023-02-27 DIAGNOSIS — R569 Unspecified convulsions: Secondary | ICD-10-CM

## 2023-02-27 NOTE — Progress Notes (Signed)
 EEG complete - results pending

## 2023-03-06 ENCOUNTER — Encounter: Payer: Self-pay | Admitting: Nurse Practitioner

## 2023-03-06 ENCOUNTER — Ambulatory Visit: Payer: Self-pay | Attending: Nurse Practitioner | Admitting: Nurse Practitioner

## 2023-03-06 VITALS — BP 127/87 | HR 106 | Resp 20 | Ht 73.0 in | Wt 151.6 lb

## 2023-03-06 DIAGNOSIS — I10 Essential (primary) hypertension: Secondary | ICD-10-CM

## 2023-03-06 DIAGNOSIS — D649 Anemia, unspecified: Secondary | ICD-10-CM

## 2023-03-06 DIAGNOSIS — R790 Abnormal level of blood mineral: Secondary | ICD-10-CM

## 2023-03-06 NOTE — Addendum Note (Signed)
 Addended by: Van Clines on: 03/06/2023 03:18 PM   Modules accepted: Orders

## 2023-03-06 NOTE — Progress Notes (Signed)
 Assessment & Plan:  Jimmy Howard was seen today for medical management of chronic issues.  Diagnoses and all orders for this visit:  Primary hypertension -     CMP14+EGFR  Low magnesium level -     Magnesium  Anemia, unspecified type -     CBC with Differential    Patient has been counseled on age-appropriate routine health concerns for screening and prevention. These are reviewed and up-to-date. Referrals have been placed accordingly. Immunizations are up-to-date or declined.    Subjective:   Chief Complaint  Patient presents with   Medical Management of Chronic Issues    Jimmy Howard 38 y.o. male presents to office today for follow up to HTN  He has a past medical history of Hypertension and seizure disorder.  Followed by neurology for his seizures and currently on keppra. Denies any recent seizure activity  Potassium is very low. Unfortunately he has not been able to swallow the potassium pills due to size. We discussed breaking them in half and adding them to applesauce or pudding. We also discussed the potential fatal side effects of low potassium.   Blood pressure is well controlled. He is taking amlodipine 5mg  daily as prescribed.  BP Readings from Last 3 Encounters:  03/06/23 127/87  02/13/23 (!) 146/101  01/07/23 (!) 157/102     Review of Systems  Constitutional:  Negative for fever, malaise/fatigue and weight loss.  HENT: Negative.  Negative for nosebleeds.   Eyes: Negative.  Negative for blurred vision, double vision and photophobia.  Respiratory: Negative.  Negative for cough and shortness of breath.   Cardiovascular: Negative.  Negative for chest pain, palpitations and leg swelling.  Gastrointestinal: Negative.  Negative for heartburn, nausea and vomiting.  Musculoskeletal: Negative.  Negative for myalgias.  Neurological: Negative.  Negative for dizziness, focal weakness, seizures and headaches.  Psychiatric/Behavioral: Negative.  Negative for suicidal  ideas.     Past Medical History:  Diagnosis Date   Hypertension     History reviewed. No pertinent surgical history.  Family History  Family history unknown: Yes    Social History Reviewed with no changes to be made today.   Outpatient Medications Prior to Visit  Medication Sig Dispense Refill   amLODipine (NORVASC) 5 MG tablet Take 1 tablet (5 mg total) by mouth daily. 90 tablet 3   levETIRAcetam (KEPPRA) 500 MG tablet Take 1 tablet (500 mg total) by mouth 2 (two) times daily. 60 tablet 11   potassium chloride SA (KLOR-CON M) 20 MEQ tablet Take 2 tablets (40 mEq total) by mouth 2 (two) times daily for 5 days, THEN 1 tablet (20 mEq total) daily. (Patient not taking: Reported on 03/06/2023) 30 tablet 2   No facility-administered medications prior to visit.    No Known Allergies     Objective:    BP 127/87 (BP Location: Left Arm, Patient Position: Sitting, Cuff Size: Normal)   Pulse (!) 106   Resp 20   Ht 6\' 1"  (1.854 m)   Wt 151 lb 9.6 oz (68.8 kg)   SpO2 99%   BMI 20.00 kg/m  Wt Readings from Last 3 Encounters:  03/06/23 151 lb 9.6 oz (68.8 kg)  02/13/23 150 lb 6.4 oz (68.2 kg)  01/07/23 154 lb 5.2 oz (70 kg)    Physical Exam Vitals and nursing note reviewed.  Constitutional:      Appearance: He is well-developed.  HENT:     Head: Normocephalic and atraumatic.  Cardiovascular:     Rate  and Rhythm: Regular rhythm. Tachycardia present.     Heart sounds: Normal heart sounds. No murmur heard.    No friction rub. No gallop.  Pulmonary:     Effort: Pulmonary effort is normal. No tachypnea or respiratory distress.     Breath sounds: Normal breath sounds. No decreased breath sounds, wheezing, rhonchi or rales.  Chest:     Chest wall: No tenderness.  Abdominal:     General: Bowel sounds are normal.     Palpations: Abdomen is soft.  Musculoskeletal:        General: Normal range of motion.     Cervical back: Normal range of motion.  Skin:    General: Skin is warm  and dry.  Neurological:     Mental Status: He is alert and oriented to person, place, and time.     Coordination: Coordination normal.  Psychiatric:        Behavior: Behavior normal. Behavior is cooperative.        Thought Content: Thought content normal.        Judgment: Judgment normal.          Patient has been counseled extensively about nutrition and exercise as well as the importance of adherence with medications and regular follow-up. The patient was given clear instructions to go to ER or return to medical center if symptoms don't improve, worsen or new problems develop. The patient verbalized understanding.   Follow-up: Return in about 3 months (around 06/03/2023).   Claiborne Rigg, FNP-BC [facility] [place], [place] [phone]   03/06/2023, 12:36 PM

## 2023-03-06 NOTE — Procedures (Signed)
 ELECTROENCEPHALOGRAM REPORT  Date of Study: 02/27/2023  Patient's Name: Jimmy Howard MRN: [id number] Date of Birth: 23-Jun-1985  Referring Provider: Dr. Patrcia Dolly  Clinical History: This is a 39 year old man with recurrent episodes of loss of consciousness, witnessed episode suggestive of focal seizure. EEG for classification.  Medications: Keppra  Technical Summary: A multichannel digital 1-hour EEG recording measured by the international 10-20 system with electrodes applied with paste and impedances below 5000 ohms performed in our laboratory with EKG monitoring in an awake and asleep patient.  Hyperventilation was not performed. Photic stimulation was performed.  The digital EEG was referentially recorded, reformatted, and digitally filtered in a variety of bipolar and referential montages for optimal display.    Description: The patient is awake and asleep during the recording.  During maximal wakefulness, there is a symmetric, medium voltage 9 Hz posterior dominant rhythm that attenuates with eye opening.  The record is symmetric.  During drowsiness and sleep, there is an increase in theta slowing of the background.  Vertex waves and symmetric sleep spindles were seen. Photic stimulation did not elicit any abnormalities.  There were no epileptiform discharges or electrographic seizures seen.    EKG lead was unremarkable.  Impression: This 1-hour awake and asleep EEG is normal.    Clinical Correlation: A normal EEG does not exclude a clinical diagnosis of epilepsy.  If further clinical questions remain, prolonged EEG may be helpful.  Clinical correlation is advised.   Patrcia Dolly, M.D.

## 2023-03-07 LAB — CBC WITH DIFFERENTIAL/PLATELET
Basophils Absolute: 0.1 10*3/uL (ref 0.0–0.2)
Basos: 2 %
EOS (ABSOLUTE): 0.1 10*3/uL (ref 0.0–0.4)
Eos: 1 %
Hematocrit: 29.9 % — ABNORMAL LOW (ref 37.5–51.0)
Hemoglobin: 10.2 g/dL — ABNORMAL LOW (ref 13.0–17.7)
Immature Grans (Abs): 0 10*3/uL (ref 0.0–0.1)
Immature Granulocytes: 0 %
Lymphocytes Absolute: 2.3 10*3/uL (ref 0.7–3.1)
Lymphs: 46 %
MCH: 29.7 pg (ref 26.6–33.0)
MCHC: 34.1 g/dL (ref 31.5–35.7)
MCV: 87 fL (ref 79–97)
Monocytes Absolute: 0.4 10*3/uL (ref 0.1–0.9)
Monocytes: 9 %
Neutrophils Absolute: 2.1 10*3/uL (ref 1.4–7.0)
Neutrophils: 42 %
Platelets: 426 10*3/uL (ref 150–450)
RBC: 3.44 x10E6/uL — ABNORMAL LOW (ref 4.14–5.80)
RDW: 22.3 % — ABNORMAL HIGH (ref 11.6–15.4)
WBC: 4.9 10*3/uL (ref 3.4–10.8)

## 2023-03-07 LAB — CMP14+EGFR
ALT: 7 IU/L (ref 0–44)
AST: 64 IU/L — ABNORMAL HIGH (ref 0–40)
Albumin: 5.2 g/dL — ABNORMAL HIGH (ref 4.1–5.1)
Alkaline Phosphatase: 113 IU/L (ref 44–121)
BUN/Creatinine Ratio: 14 (ref 9–20)
BUN: 12 mg/dL (ref 6–24)
Bilirubin Total: 0.8 mg/dL (ref 0.0–1.2)
CO2: 23 mmol/L (ref 20–29)
Calcium: 9.2 mg/dL (ref 8.7–10.2)
Chloride: 95 mmol/L — ABNORMAL LOW (ref 96–106)
Creatinine, Ser: 0.88 mg/dL (ref 0.76–1.27)
Globulin, Total: 3.1 g/dL (ref 1.5–4.5)
Glucose: 113 mg/dL — ABNORMAL HIGH (ref 70–99)
Potassium: 3.3 mmol/L — ABNORMAL LOW (ref 3.5–5.2)
Sodium: 140 mmol/L (ref 134–144)
Total Protein: 8.3 g/dL (ref 6.0–8.5)
eGFR: 111 mL/min/{1.73_m2} (ref >59)

## 2023-03-07 LAB — MAGNESIUM: Magnesium: 1.3 mg/dL — ABNORMAL LOW (ref 1.6–2.3)

## 2023-03-08 ENCOUNTER — Other Ambulatory Visit: Payer: Self-pay | Admitting: Nurse Practitioner

## 2023-03-08 DIAGNOSIS — K921 Melena: Secondary | ICD-10-CM

## 2023-03-08 DIAGNOSIS — D649 Anemia, unspecified: Secondary | ICD-10-CM

## 2023-03-10 ENCOUNTER — Encounter: Payer: Self-pay | Admitting: Nurse Practitioner

## 2023-03-10 ENCOUNTER — Other Ambulatory Visit: Payer: Self-pay | Admitting: Nurse Practitioner

## 2023-03-10 DIAGNOSIS — R790 Abnormal level of blood mineral: Secondary | ICD-10-CM

## 2023-03-10 DIAGNOSIS — E876 Hypokalemia: Secondary | ICD-10-CM

## 2023-03-10 MED ORDER — MAGNESIUM CHLORIDE 64 MG PO TABS: 1.0000 | ORAL_TABLET | Freq: Every day | ORAL | 1 refills | Status: DC

## 2023-03-10 MED ORDER — POTASSIUM CHLORIDE CRYS ER 20 MEQ PO TBCR: 20.0000 meq | EXTENDED_RELEASE_TABLET | Freq: Every day | ORAL | 1 refills | Status: DC

## 2023-04-22 ENCOUNTER — Ambulatory Visit: Payer: Self-pay | Admitting: Nurse Practitioner

## 2023-05-06 ENCOUNTER — Other Ambulatory Visit: Payer: Self-pay

## 2023-05-15 ENCOUNTER — Other Ambulatory Visit: Payer: Self-pay

## 2023-05-29 ENCOUNTER — Ambulatory Visit (INDEPENDENT_AMBULATORY_CARE_PROVIDER_SITE_OTHER): Payer: Self-pay | Admitting: Neurology

## 2023-05-29 ENCOUNTER — Encounter: Payer: Self-pay | Admitting: Neurology

## 2023-05-29 ENCOUNTER — Other Ambulatory Visit: Payer: Self-pay

## 2023-05-29 VITALS — BP 134/89 | HR 104 | Ht 73.0 in | Wt 148.6 lb

## 2023-05-29 DIAGNOSIS — G40909 Epilepsy, unspecified, not intractable, without status epilepticus: Secondary | ICD-10-CM | POA: Diagnosis not present

## 2023-05-29 MED ORDER — LEVETIRACETAM 500 MG PO TABS
500.0000 mg | ORAL_TABLET | Freq: Two times a day (BID) | ORAL | 11 refills | Status: DC
  Filled 2023-05-29: qty 60, 30d supply, fill #0

## 2023-05-29 NOTE — Progress Notes (Signed)
 Hasn't had medication in a month and a half because of insurance ,

## 2023-05-29 NOTE — Patient Instructions (Signed)
 Good to see you.  Start taking the Levetiracetam  (Keppra ) 500mg  twice a day on a regular basis. Use an alarm on your phone and pillbox to help with reminders  2. Keep a calendar of the seizures  3. Follow-up in 3-4 months, call for any changes   Seizure Precautions: 1. If medication has been prescribed for you to prevent seizures, take it exactly as directed.  Do not stop taking the medicine without talking to your doctor first, even if you have not had a seizure in a long time.   2. Avoid activities in which a seizure would cause danger to yourself or to others.  Don't operate dangerous machinery, swim alone, or climb in high or dangerous places, such as on ladders, roofs, or girders.  Do not drive unless your doctor says you may.  3. If you have any warning that you may have a seizure, lay down in a safe place where you can't hurt yourself.    4.  No driving for 6 months from last seizure, as per [place]  state law.   Please refer to the following link on the Epile[employer] of America's website for more information: http://www.epilepsyfoundation.org/answerplace/Social/driving/drivingu.cfm   5.  Maintain good sleep hygiene. Avoid alcohol  6.  Contact your doctor if you have any problems that may be related to the medicine you are taking.  7.  Call 911 and bring the patient back to the ED if:        A.  The seizure lasts longer than 5 minutes.       B.  The patient doesn't awaken shortly after the seizure  C.  The patient has new problems such as difficulty seeing, speaking or moving  D.  The patient was injured during the seizure  E.  The patient has a temperature over 102 F (39C)  F.  The patient vomited and now is having trouble breathing

## 2023-05-29 NOTE — Progress Notes (Signed)
 NEUROLOGY FOLLOW UP OFFICE NOTE  Jimmy Howard [id number] 05-29-1985  HISTORY OF PRESENT ILLNESS: I had the pleasure of seeing Jimmy Howard in follow-up in the neurology clinic on 05/29/2023.  The patient was last seen 3 months ago for  recurrent episodes suggestive of seizures. He is again accompanied by his significant other Alycia who helps supplement the history today.  Records and images were personally reviewed where available.  His 1-hour EEG in 02/2023 was normal. He was started on Levetiracetam  500mg  BID on last visit. She reports a seizure in March, he was sitting on the edge of the bed and came to with soreness in his legs and stomach, no focal weakness, tongue bite or incontinence. His son said he was reaching out, grabbing, vocalizing, going from side to side for a couple of minutes. When Alycia came home, he was flushed and said nothing happened. He reports stopping Levetiracetam  1.5 months ago, he ran out then it was cost-prohibitive due to loss of insurance. He may still have been taking it when the seizure occurred but was not taking it BID. Alycia reports he stares off a lot but responds when called. He denies any olfactory/gustatory hallucinations, myoclonic jerks. Every now and then, he feels shaky and a little weak. His left arm and leg get numb, no neck pain.  No headaches, dizziness, vision changes, no falls. Sleep and mood are good.    History on Initial Assessment 02/13/2023: This is a pleasant 38 year old right-handed man with a history of hypertension presenting for evaluation of seizure-like activity. The first episode occurred on 07/17/22, he was at work and had no prior warning, waking up on the ground feeling fine. His manager told him he was shaking and had to turn him on his side. He denied any focal weakness, tongue bite, or incontinence, and felt fine to drive himself home. He went to the ER the next day where BP was 177/114. CK elevated 727, UDS positive for THC. I  personally reviewed head CT without contrast, no acute changes. 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 The second episode occurred after Thanksgiving 2024, Alycia reports he was laying in be then started choking, he was making vocalizations, reaching out, trying to grab at her, trying to get out of bed, unresponsive for 5 minutes. He was confused for 15 minutes after, unable to initially answer EMS questions. He did not want to go to the ER. Alycia recalls earlier that morning he told her he was feeling weak. 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 No shaking reported. He went to the ER where BP was 155/111, potassium 2.7, UDS positive for THC. Head CT no acute changes.   Alycia denies any staring/unresponsive episodes. He denies any gaps in time, olfactory/gustatory hallucinations, deja vu, rising epigastric sensation, focal numbness/tingling/weakness, myoclonic jerks. No headaches, dizziness (except when he felt weak), diplopia/dysarthria/dysphagia, neck/back pain, bowel/bladder dysfunction. He gets at least 8 hours of sleep. Mood is fine. Memory is "as good as it is going to be." He drinks at least a fifth a week (drinking every other night), no change in intake prior to the episodes. He lives with Alycia and their 2 teenage sons.  He had a normal birth and early  development.  There is no history of febrile convulsions, CNS infections such as meningitis/encephalitis, significant traumatic brain injury, neurosurgical procedures, or family history of seizures.   PAST MEDICAL HISTORY: Past Medical History:  Diagnosis Date   Hypertension     MEDICATIONS: Current Outpatient Medications on File Prior to Visit  Medication Sig Dispense Refill    amLODipine  (NORVASC ) 5 MG tablet Take 1 tablet (5 mg total) by mouth daily. 90 tablet 3   levETIRAcetam  (KEPPRA ) 500 MG tablet Take 1 tablet (500 mg total) by mouth 2 (two) times daily. 60 tablet 11   Magnesium  Chloride 64 MG TABS Take 1 tablet by mouth daily. 90 tablet 1   potassium chloride  SA (KLOR-CON  M) 20 MEQ tablet Take 1 tablet (20 mEq total) by mouth daily. 90 tablet 1   No current facility-administered medications on file prior to visit.    ALLERGIES: No Known Allergies  FAMILY HISTORY: Family History  Family history unknown: Yes    SOCIAL HISTORY: Social History   Socioeconomic History   Marital status: Single    Spouse name: Not on file   Number of children: Not on file   Years of education: Not on file   Highest education level: 12th grade  Occupational History   Not on file  Tobacco Use   Smoking status: Every Day    Types: Cigarettes   Smokeless tobacco: Never  Vaping Use   Vaping status: Never Used  Substance and Sexual Activity   Alcohol use: No    Comment: occ.   Drug use: No   Sexual activity: Not Currently  Other Topics Concern   Not on file  Social History Narrative   Are you right handed or left handed? Right    Are you currently employed ?  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 No    Who lives with you? Family    What type of home do you live in: 1 story or 2 story?  2 story        Social Drivers of Corporate investment banker Strain: Low Risk  (11/11/2022)   Overall Financial Resource Strain (CARDIA)    Difficulty of Paying Living Expenses: Not hard at all  Food Insecurity: No Food Insecurity (11/11/2022)   Hunger Vital Sign    Worried About Running Out of Food in the Last Year: Never true    Ran Out of Food in the Last Year: Never true  Transportation Needs: No Transportation Needs (11/11/2022)   PRAPARE - Administrator, Civil Service (Medical): No    Lack of  Transportation (Non-Medical): No  Physical Activity: Sufficiently Active (11/11/2022)   Exercise Vital Sign    Days of Exercise per Week: 7 days    Minutes of Exercise per Session: 30 min  Stress: No Stress Concern Present (11/11/2022)   [employer] of Occupational Health - Occupational Stress Questionnaire    Feeling of Stress : Not at all  Social Connections: Moderately Isolated (11/11/2022)   Social Connection and Isolation Panel [NHANES]    Frequency of Communication with Friends and Family: Three times a week    Frequency of Social Gatherings with Friends and Family: Twice a week    Attends Religious Services: Never    Database administrator or Organizations: No    Attends Engineer, structural: Not on file    Marital Status: Living with  partner  Intimate Partner Violence: Not At Risk (11/28/2022)   Humiliation, Afraid, Rape, and Kick questionnaire    Fear of Current or Ex-Partner: No    Emotionally Abused: No    Physically Abused: No    Sexually Abused: No     PHYSICAL EXAM: Vitals:   05/29/23 1308  BP: 134/89  Pulse: (!) 104  SpO2: 99%   General: No acute distress Head:  Normocephalic/atraumatic Skin/Extremities: No rash, no edema Neurological Exam: alert and awake. No aphasia or dysarthria. Fund of knowledge is appropriate.   Attention and concentration are normal.   Cranial nerves: Pupils equal, round. Extraocular movements intact with no nystagmus. Visual fields full.  No facial asymmetry.  Motor: Bulk and tone normal, muscle strength 5/5 throughout with no pronator drift.   Finger to nose testing intact.  Gait narrow-based and steady, able to tandem walk adequately.  Romberg negative.   IMPRESSION: This is a 38 yo RH man with a history of hypertension with recurrent episodes of loss of consciousness, witnessed episode suggestive of focal seizure. He had another seizure in 03/2023. Head CT x 2 no acute changes, EEG normal. We discussed symptoms remain  concerning for seizures, advised to restart Levetiracetam  500mg  BID. We discussed avoidance of seizure triggers, he was advised to start using an alarm and pillbox to help with compliance. Keep a calendar of seizures. He is aware of [place] driving laws to stop driving until 6 months seizure-free. Follow-up in 3-4 months, call for any changes.   Thank you for allowing me to participate in his care.  Please do not hesitate to call for any questions or concerns.    Rayfield Cairo, M.D.   CC: Winda Hastings, NP

## 2023-06-04 ENCOUNTER — Ambulatory Visit: Payer: Self-pay | Admitting: Nurse Practitioner

## 2023-06-10 ENCOUNTER — Other Ambulatory Visit: Payer: Self-pay

## 2023-06-12 ENCOUNTER — Other Ambulatory Visit: Payer: Self-pay

## 2023-06-12 ENCOUNTER — Other Ambulatory Visit: Payer: Self-pay | Admitting: Nurse Practitioner

## 2023-06-12 DIAGNOSIS — F1020 Alcohol dependence, uncomplicated: Secondary | ICD-10-CM

## 2023-06-12 DIAGNOSIS — I10 Essential (primary) hypertension: Secondary | ICD-10-CM

## 2023-06-12 MED ORDER — AMLODIPINE BESYLATE 10 MG PO TABS
10.0000 mg | ORAL_TABLET | Freq: Every day | ORAL | 1 refills | Status: AC
  Filled 2023-06-12 – 2023-07-23 (×2): qty 90, 90d supply, fill #0
  Filled 2024-01-06: qty 90, 90d supply, fill #1

## 2023-06-12 MED ORDER — NALTREXONE HCL 50 MG PO TABS
ORAL_TABLET | ORAL | 1 refills | Status: DC
  Filled 2023-06-12: qty 30, 15d supply, fill #0

## 2023-06-12 MED ORDER — LOSARTAN POTASSIUM 25 MG PO TABS
25.0000 mg | ORAL_TABLET | Freq: Every day | ORAL | 1 refills | Status: AC
  Filled 2023-06-12: qty 30, 30d supply, fill #0

## 2023-06-21 ENCOUNTER — Other Ambulatory Visit: Payer: Self-pay

## 2023-07-15 ENCOUNTER — Encounter (HOSPITAL_BASED_OUTPATIENT_CLINIC_OR_DEPARTMENT_OTHER): Payer: Self-pay

## 2023-07-15 ENCOUNTER — Emergency Department (HOSPITAL_BASED_OUTPATIENT_CLINIC_OR_DEPARTMENT_OTHER)
Admission: EM | Admit: 2023-07-15 | Discharge: 2023-07-16 | Discharge status: Against Medical Advice | Attending: Emergency Medicine | Admitting: Emergency Medicine

## 2023-07-15 DIAGNOSIS — Y92009 Unspecified place in unspecified non-institutional (private) residence as the place of occurrence of the external cause: Secondary | ICD-10-CM | POA: Diagnosis not present

## 2023-07-15 DIAGNOSIS — I10 Essential (primary) hypertension: Secondary | ICD-10-CM | POA: Diagnosis not present

## 2023-07-15 DIAGNOSIS — S0181XA Laceration without foreign body of other part of head, initial encounter: Secondary | ICD-10-CM | POA: Insufficient documentation

## 2023-07-15 DIAGNOSIS — W19XXXA Unspecified fall, initial encounter: Secondary | ICD-10-CM | POA: Insufficient documentation

## 2023-07-15 DIAGNOSIS — Z79899 Other long term (current) drug therapy: Secondary | ICD-10-CM | POA: Diagnosis not present

## 2023-07-15 DIAGNOSIS — N179 Acute kidney failure, unspecified: Secondary | ICD-10-CM | POA: Diagnosis not present

## 2023-07-15 DIAGNOSIS — E876 Hypokalemia: Secondary | ICD-10-CM | POA: Insufficient documentation

## 2023-07-15 DIAGNOSIS — R55 Syncope and collapse: Secondary | ICD-10-CM | POA: Insufficient documentation

## 2023-07-15 DIAGNOSIS — D649 Anemia, unspecified: Secondary | ICD-10-CM | POA: Diagnosis not present

## 2023-07-15 DIAGNOSIS — Z5329 Procedure and treatment not carried out because of patient's decision for other reasons: Secondary | ICD-10-CM | POA: Insufficient documentation

## 2023-07-15 HISTORY — DX: Unspecified convulsions: R56.9

## 2023-07-15 LAB — CBC WITH DIFFERENTIAL/PLATELET
Abs Immature Granulocytes: 0.05 K/uL (ref 0.00–0.07)
Basophils Absolute: 0.1 K/uL (ref 0.0–0.1)
Basophils Relative: 1 %
Eosinophils Absolute: 0 K/uL (ref 0.0–0.5)
Eosinophils Relative: 0 %
HCT: 25.9 % — ABNORMAL LOW (ref 39.0–52.0)
Hemoglobin: 8.7 g/dL — ABNORMAL LOW (ref 13.0–17.0)
Immature Granulocytes: 0 %
Lymphocytes Relative: 9 %
Lymphs Abs: 1.1 K/uL (ref 0.7–4.0)
MCH: 28.5 pg (ref 26.0–34.0)
MCHC: 33.6 g/dL (ref 30.0–36.0)
MCV: 84.9 fL (ref 80.0–100.0)
Monocytes Absolute: 0.7 K/uL (ref 0.1–1.0)
Monocytes Relative: 6 %
Neutro Abs: 9.9 K/uL — ABNORMAL HIGH (ref 1.7–7.7)
Neutrophils Relative %: 84 %
Platelets: 290 K/uL (ref 150–400)
RBC: 3.05 MIL/uL — ABNORMAL LOW (ref 4.22–5.81)
RDW: 23.8 % — ABNORMAL HIGH (ref 11.5–15.5)
WBC: 11.6 K/uL — ABNORMAL HIGH (ref 4.0–10.5)
nRBC: 0.3 % — ABNORMAL HIGH (ref 0.0–0.2)

## 2023-07-15 LAB — COMPREHENSIVE METABOLIC PANEL WITH GFR
ALT: 9 U/L (ref 0–44)
AST: 90 U/L — ABNORMAL HIGH (ref 15–41)
Albumin: 5.3 g/dL — ABNORMAL HIGH (ref 3.5–5.0)
Alkaline Phosphatase: 128 U/L — ABNORMAL HIGH (ref 38–126)
Anion gap: 22 — ABNORMAL HIGH (ref 5–15)
BUN: 5 mg/dL — ABNORMAL LOW (ref 6–20)
CO2: 25 mmol/L (ref 22–32)
Calcium: 8.4 mg/dL — ABNORMAL LOW (ref 8.9–10.3)
Chloride: 89 mmol/L — ABNORMAL LOW (ref 98–111)
Creatinine, Ser: 1.31 mg/dL — ABNORMAL HIGH (ref 0.61–1.24)
GFR, Estimated: >60 mL/min (ref >60)
Glucose, Bld: 124 mg/dL — ABNORMAL HIGH (ref 70–99)
Potassium: 2.4 mmol/L — CL (ref 3.5–5.1)
Sodium: 136 mmol/L (ref 135–145)
Total Bilirubin: 1.5 mg/dL — ABNORMAL HIGH (ref 0.0–1.2)
Total Protein: 9.1 g/dL — ABNORMAL HIGH (ref 6.5–8.1)

## 2023-07-15 LAB — CBG MONITORING, ED: Glucose-Capillary: 119 mg/dL — ABNORMAL HIGH (ref 70–99)

## 2023-07-15 NOTE — ED Triage Notes (Addendum)
 Pt states he has felt dizzy all day and passed out landing on floor at home Pt has laceration to chin Pt states he feels weak and has left sided rib pain from fall Does have hx of seizures and prescribed Keppra  and he is not taking it.  Per family this was not a seizure today

## 2023-07-16 LAB — MAGNESIUM: Magnesium: 0.9 mg/dL — CL (ref 1.7–2.4)

## 2023-07-16 MED ORDER — LIDOCAINE-EPINEPHRINE (PF) 2 %-1:200000 IJ SOLN
10.0000 mL | Freq: Once | INTRAMUSCULAR | Status: AC
  Administered 2023-07-16: 10 mL
  Filled 2023-07-16: qty 20

## 2023-07-16 NOTE — ED Notes (Signed)
 Pt stated that they wanted to leave. Pt was made aware of the risks of leaving against medical advice including worsening worsening condition up to and including death. Pt verbalized understanding and signed AMA paperwork

## 2023-07-16 NOTE — ED Provider Notes (Signed)
 [place] EMERGENCY DEPARTMENT AT [facility]  Provider Note  CSN: [id number] Arrival date & time: 07/15/23 2059  History Chief Complaint  Patient presents with   Dizziness   Near Syncope    Jimmy Howard is a 38 y.o. male with history of HTN, seizure disorder, occasional EtOH use and prior hypokalemia brought to the ED by wife for evaluation of loss of consciousness. He reports he got overheated working on a house and passed out. He did not have reported seizure activity. He sustained a laceration to his chin. He has not been taking his K supplements or his Keppra  recently.    Home Medications Prior to Admission medications   Medication Sig Start Date End Date Taking? Authorizing Provider  amLODipine  (NORVASC ) 10 MG tablet Take 1 tablet (10 mg total) by mouth daily. 06/12/23   Fleming, Zelda W, NP  levETIRAcetam  (KEPPRA ) 500 MG tablet Take 1 tablet (500 mg total) by mouth 2 (two) times daily. 05/29/23   Georjean Darice HERO, MD  losartan  (COZAAR ) 25 MG tablet Take 1 tablet (25 mg total) by mouth daily. 06/12/23   Fleming, Zelda W, NP  naltrexone  (DEPADE) 50 MG tablet Start with half tablet for 4 days. Then increase to a whole tablet once daily. Increase to 2 tablets after 1 week if needed. 06/12/23   Fleming, Zelda W, NP     Allergies    Patient has no known allergies.   Review of Systems   Review of Systems Please see HPI for pertinent positives and negatives  Physical Exam BP (!) 133/92   Pulse 86   Temp 98.6 F (37 C) (Oral)   Resp 18   Ht 6' 1 (1.854 m)   Wt 68 kg   SpO2 100%   BMI 19.79 kg/m   Physical Exam Vitals and nursing note reviewed.  Constitutional:      Appearance: Normal appearance.  HENT:     Head: Normocephalic.     Comments: 3cm laceration to R chin    Nose: Nose normal.     Mouth/Throat:     Mouth: Mucous membranes are moist.  Eyes:     Extraocular Movements: Extraocular movements intact.     Conjunctiva/sclera: Conjunctivae normal.   Cardiovascular:     Rate and Rhythm: Normal rate.  Pulmonary:     Effort: Pulmonary effort is normal.     Breath sounds: Normal breath sounds.  Abdominal:     General: Abdomen is flat.     Palpations: Abdomen is soft.     Tenderness: There is no abdominal tenderness.  Musculoskeletal:        General: No swelling. Normal range of motion.     Cervical back: Neck supple.  Skin:    General: Skin is warm and dry.  Neurological:     General: No focal deficit present.     Mental Status: He is alert and oriented to person, place, and time.     Cranial Nerves: No cranial nerve deficit.     Sensory: No sensory deficit.     Motor: No weakness.  Psychiatric:        Mood and Affect: Mood normal.     ED Results / Procedures / Treatments   EKG EKG Interpretation Date/Time:  Monday July 15 2023 21:07:59 EDT Ventricular Rate:  111 PR Interval:  136 QRS Duration:  82 QT Interval:  398 QTC Calculation: 541 R Axis:   63  Text Interpretation: Sinus tachycardia Marked ST abnormality, possible  inferior subendocardial injury Prolonged QT Abnormal ECG When compared with ECG of 17-Jul-2022 19:29, PREVIOUS ECG IS PRESENT Since last tracing Non-specific ST-t changes Confirmed by Roselyn Dunnings [phone]) on 07/16/2023 12:19:58 AM  Procedures .Laceration Repair  Date/Time: 07/16/2023 12:43 AM  Performed by: Roselyn Dunnings NOVAK, MD Authorized by: Roselyn Dunnings NOVAK, MD   Consent:    Consent obtained:  Verbal   Consent given by:  Patient Anesthesia:    Anesthesia method:  Local infiltration   Local anesthetic:  Lidocaine  2% WITH epi Laceration details:    Location:  Face   Face location:  Chin   Length (cm):  3 Pre-procedure details:    Preparation:  Patient was prepped and draped in usual sterile fashion Exploration:    Wound exploration: wound explored through full range of motion and entire depth of wound visualized     Contaminated: yes (facial hair)   Treatment:    Area cleansed with:   Povidone-iodine   Amount of cleaning:  Standard   Irrigation solution:  Sterile saline   Irrigation method:  Syringe Skin repair:    Repair method:  Sutures   Suture size:  4-0   Suture material:  Prolene   Suture technique:  Simple interrupted   Number of sutures:  4 Approximation:    Approximation:  Close Repair type:    Repair type:  Simple Post-procedure details:    Dressing:  Open (no dressing)   Procedure completion:  Tolerated well, no immediate complications .Critical Care  Performed by: Roselyn Dunnings NOVAK, MD Authorized by: Roselyn Dunnings NOVAK, MD   Critical care provider statement:    Critical care time (minutes):  30   Critical care time was exclusive of:  Separately billable procedures and treating other patients   Critical care was necessary to treat or prevent imminent or life-threatening deterioration of the following conditions:  Metabolic crisis   Critical care was time spent personally by me on the following activities:  Development of treatment plan with patient or surrogate, discussions with consultants, evaluation of patient's response to treatment, examination of patient, ordering and review of laboratory studies, ordering and review of radiographic studies, ordering and performing treatments and interventions, pulse oximetry, re-evaluation of patient's condition and review of old charts   Medications Ordered in the ED Medications  lidocaine -EPINEPHrine  (XYLOCAINE  W/EPI) 2 %-1:200000 (PF) injection 10 mL (10 mLs Infiltration Given 07/16/23 0020)    Initial Impression and Plan  Patient here with a syncopal episode leading to a chin laceration, he had labs done in triage showing CBC with anemia worse than previous. CMP with marked hypokalemia, increased Cr suspicious for AKI, low Chloride. On my arrival to room, patient had asked RN to take his IV out and was planning to leave. I explained his lab findings and need for K replacement, both orally and IV and possible  admission. He refuses both, states he will take his K-dur at home, indicates he has a sufficient supply and that he only wants his laceration repaired at this time. He understands the potential life threatening nature of his lab findings. Wound was repaired as above. Patient will sign out AMA.   ED Course       MDM Rules/Calculators/A&P Medical Decision Making Problems Addressed: AKI (acute kidney injury) [facility]): acute illness or injury Anemia, unspecified type: chronic illness or injury with exacerbation, progression, or side effects of treatment Chin laceration, initial encounter: acute illness or injury Hypokalemia: chronic illness or injury with exacerbation, progression, or side effects  of treatment Syncope, unspecified syncope type: acute illness or injury  Amount and/or Complexity of Data Reviewed Labs: ordered. Decision-making details documented in ED Course. ECG/medicine tests: ordered and independent interpretation performed. Decision-making details documented in ED Course.  Risk Prescription drug management. Decision regarding hospitalization.     Final Clinical Impression(s) / ED Diagnoses Final diagnoses:  Syncope, unspecified syncope type  Hypokalemia  AKI (acute kidney injury) (HCC)  Anemia, unspecified type  Chin laceration, initial encounter    Rx / DC Orders ED Discharge Orders     None        Roselyn Carlin NOVAK, MD 07/16/23 [phone]

## 2023-07-16 NOTE — ED Notes (Signed)
 MD notified of low Magnesium  of 0.9

## 2023-07-24 ENCOUNTER — Other Ambulatory Visit: Payer: Self-pay

## 2023-07-30 ENCOUNTER — Other Ambulatory Visit: Payer: Self-pay

## 2023-09-19 ENCOUNTER — Ambulatory Visit: Admitting: Neurology

## 2023-09-19 ENCOUNTER — Encounter: Payer: Self-pay | Admitting: Neurology

## 2023-10-24 ENCOUNTER — Emergency Department (HOSPITAL_COMMUNITY)
Admission: EM | Admit: 2023-10-24 | Discharge: 2023-10-24 | Disposition: A | Discharge status: Home / Self Care | Attending: Emergency Medicine | Admitting: Emergency Medicine

## 2023-10-24 ENCOUNTER — Encounter (HOSPITAL_COMMUNITY): Payer: Self-pay

## 2023-10-24 ENCOUNTER — Other Ambulatory Visit: Payer: Self-pay

## 2023-10-24 DIAGNOSIS — E876 Hypokalemia: Secondary | ICD-10-CM | POA: Insufficient documentation

## 2023-10-24 DIAGNOSIS — D72829 Elevated white blood cell count, unspecified: Secondary | ICD-10-CM | POA: Diagnosis not present

## 2023-10-24 DIAGNOSIS — E871 Hypo-osmolality and hyponatremia: Secondary | ICD-10-CM | POA: Diagnosis not present

## 2023-10-24 DIAGNOSIS — R569 Unspecified convulsions: Secondary | ICD-10-CM | POA: Diagnosis present

## 2023-10-24 LAB — CBC WITH DIFFERENTIAL/PLATELET
Basophils Absolute: 0.1 K/uL (ref 0.0–0.1)
Basophils Relative: 1 %
Eosinophils Absolute: 0 K/uL (ref 0.0–0.5)
Eosinophils Relative: 0 %
HCT: 21.9 % — ABNORMAL LOW (ref 39.0–52.0)
Hemoglobin: 7.1 g/dL — ABNORMAL LOW (ref 13.0–17.0)
Lymphocytes Relative: 6 %
Lymphs Abs: 0.7 K/uL (ref 0.7–4.0)
MCH: 26.5 pg (ref 26.0–34.0)
MCHC: 32.4 g/dL (ref 30.0–36.0)
MCV: 81.7 fL (ref 80.0–100.0)
Monocytes Absolute: 0.2 K/uL (ref 0.1–1.0)
Monocytes Relative: 2 %
Neutro Abs: 11.1 K/uL — ABNORMAL HIGH (ref 1.7–7.7)
Neutrophils Relative %: 91 %
Platelets: 245 K/uL (ref 150–400)
RBC: 2.68 MIL/uL — ABNORMAL LOW (ref 4.22–5.81)
RDW: 27.8 % — ABNORMAL HIGH (ref 11.5–15.5)
WBC: 12.2 K/uL — ABNORMAL HIGH (ref 4.0–10.5)
nRBC: 0.5 % — ABNORMAL HIGH (ref 0.0–0.2)

## 2023-10-24 LAB — I-STAT CG4 LACTIC ACID, ED: Lactic Acid, Venous: 12.2 mmol/L (ref 0.5–1.9)

## 2023-10-24 LAB — COMPREHENSIVE METABOLIC PANEL WITH GFR
ALT: 12 U/L (ref 0–44)
AST: 61 U/L — ABNORMAL HIGH (ref 15–41)
Albumin: 4.8 g/dL (ref 3.5–5.0)
Alkaline Phosphatase: 75 U/L (ref 38–126)
Anion gap: 27 — ABNORMAL HIGH (ref 5–15)
BUN: 10 mg/dL (ref 6–20)
CO2: 15 mmol/L — ABNORMAL LOW (ref 22–32)
Calcium: 7.8 mg/dL — ABNORMAL LOW (ref 8.9–10.3)
Chloride: 87 mmol/L — ABNORMAL LOW (ref 98–111)
Creatinine, Ser: 1.31 mg/dL — ABNORMAL HIGH (ref 0.61–1.24)
GFR, Estimated: >60 mL/min (ref >60)
Glucose, Bld: 122 mg/dL — ABNORMAL HIGH (ref 70–99)
Potassium: 2.4 mmol/L — CL (ref 3.5–5.1)
Sodium: 129 mmol/L — ABNORMAL LOW (ref 135–145)
Total Bilirubin: 1.6 mg/dL — ABNORMAL HIGH (ref 0.0–1.2)
Total Protein: 8.6 g/dL — ABNORMAL HIGH (ref 6.5–8.1)

## 2023-10-24 LAB — MAGNESIUM: Magnesium: 0.9 mg/dL — CL (ref 1.7–2.4)

## 2023-10-24 MED ORDER — LEVETIRACETAM (KEPPRA) 500 MG/5 ML ADULT IV PUSH
1500.0000 mg | Freq: Once | INTRAVENOUS | Status: AC
  Administered 2023-10-24: 1500 mg via INTRAVENOUS
  Filled 2023-10-24: qty 15

## 2023-10-24 MED ORDER — POTASSIUM CHLORIDE 10 MEQ/100ML IV SOLN
10.0000 meq | Freq: Once | INTRAVENOUS | Status: AC
  Administered 2023-10-24: 10 meq via INTRAVENOUS
  Filled 2023-10-24: qty 100

## 2023-10-24 MED ORDER — MAGNESIUM 400 MG PO TABS: 1.0000 | ORAL_TABLET | Freq: Every day | ORAL | 1 refills | Status: AC

## 2023-10-24 MED ORDER — MAGNESIUM SULFATE 2 GM/50ML IV SOLN
2.0000 g | Freq: Once | INTRAVENOUS | Status: AC
  Administered 2023-10-24: 2 g via INTRAVENOUS
  Filled 2023-10-24: qty 50

## 2023-10-24 MED ORDER — POTASSIUM CHLORIDE CRYS ER 20 MEQ PO TBCR
40.0000 meq | EXTENDED_RELEASE_TABLET | Freq: Once | ORAL | Status: AC
  Administered 2023-10-24: 40 meq via ORAL
  Filled 2023-10-24: qty 2

## 2023-10-24 MED ORDER — SODIUM CHLORIDE 0.9 % IV BOLUS
1000.0000 mL | Freq: Once | INTRAVENOUS | Status: AC
  Administered 2023-10-24: 1000 mL via INTRAVENOUS

## 2023-10-24 MED ORDER — LEVETIRACETAM 500 MG PO TABS: 500.0000 mg | ORAL_TABLET | Freq: Two times a day (BID) | ORAL | 1 refills | Status: AC

## 2023-10-24 NOTE — Discharge Instructions (Addendum)
 You were seen in the emergency department after a seizure Most likely reason for your seizure you would be because you are not taking your Keppra .  We have called in a refill for your Keppra  prescription which you should begin taking 2 prevent additional seizures Your potassium and magnesium  were very low We have also called in a prescription for magnesium  for you to pick up in your pharmacy begin taking as directed You need to follow-up with your neurologist to discuss today's seizure and to be reevaluated in the office Please be aware that drinking alcohol will lower your seizure threshold We offered you admission to stay in the hospital but you did not wish to stay at this time Return to the emergency department for recurrent seizures or any other concerns  Outpatient seizure precautions: Per [place]  DMV statutes, patients with seizures are not allowed to drive until  they have been seizure-free for six months. Use caution when using heavy equipment or power tools. Avoid working on ladders or at heights. Take showers instead of baths. Ensure the water temperature is not too high on the home water heater. Do not go swimming alone. When caring for infants or small children, sit down when holding, feeding, or changing them to minimize risk of injury to the child in the event you have a seizure. Also, Maintain good sleep hygiene. Avoid alcohol.

## 2023-10-24 NOTE — ED Notes (Signed)
Shift report received, assumed care of patient at this time 

## 2023-10-24 NOTE — ED Provider Notes (Signed)
 [place] EMERGENCY DEPARTMENT AT [facility] Provider Note   CSN: [id number] Arrival date & time: 10/24/23  8166     Patient presents with: Seizures   Jimmy Howard is a 38 y.o. male.  With a history of seizure disorder and alcohol use disorder presents to the ED after seizure.  Patient had a witnessed seizure at home that lasted for less than 5 minutes.  Was initially confused but returned to neurologic baseline.  Tells me he has not taken his Keppra  as he does not have insurance and ran out of this medication.  Drinks alcohol daily.  Denies headaches nausea vomiting fevers chills    Seizures      Prior to Admission medications   Medication Sig Start Date End Date Taking? Authorizing Provider  Magnesium  400 MG TABS Take 1 tablet by mouth daily. 10/24/23 11/23/23 Yes Pamella Ozell DELENA, DO  amLODipine  (NORVASC ) 10 MG tablet Take 1 tablet (10 mg total) by mouth daily. 06/12/23   Fleming, Zelda W, NP  levETIRAcetam  (KEPPRA ) 500 MG tablet Take 1 tablet (500 mg total) by mouth 2 (two) times daily. 10/24/23   Pamella Ozell DELENA, DO  losartan  (COZAAR ) 25 MG tablet Take 1 tablet (25 mg total) by mouth daily. 06/12/23   Fleming, Zelda W, NP  naltrexone  (DEPADE) 50 MG tablet Start with half tablet for 4 days. Then increase to a whole tablet once daily. Increase to 2 tablets after 1 week if needed. 06/12/23   Fleming, Zelda W, NP    Allergies: Patient has no known allergies.    Review of Systems  Neurological:  Positive for seizures.    Updated Vital Signs BP (!) 157/87   Pulse 88   Temp 98.6 F (37 C)   Resp 16   Ht 6' (1.829 m)   Wt 72.6 kg   SpO2 100%   BMI 21.70 kg/m   Physical Exam Vitals and nursing note reviewed.  HENT:     Head: Normocephalic and atraumatic.  Eyes:     Pupils: Pupils are equal, round, and reactive to light.  Cardiovascular:     Rate and Rhythm: Normal rate and regular rhythm.  Pulmonary:     Effort: Pulmonary effort is normal.     Breath  sounds: Normal breath sounds.  Abdominal:     Palpations: Abdomen is soft.     Tenderness: There is no abdominal tenderness.  Skin:    General: Skin is warm and dry.  Neurological:     General: No focal deficit present.     Mental Status: He is alert.     Sensory: No sensory deficit.     Motor: No weakness.  Psychiatric:        Mood and Affect: Mood normal.     (all labs ordered are listed, but only abnormal results are displayed) Labs Reviewed  COMPREHENSIVE METABOLIC PANEL WITH GFR - Abnormal; Notable for the following components:      Result Value   Sodium 129 (*)    Potassium 2.4 (*)    Chloride 87 (*)    CO2 15 (*)    Glucose, Bld 122 (*)    Creatinine, Ser 1.31 (*)    Calcium 7.8 (*)    Total Protein 8.6 (*)    AST 61 (*)    Total Bilirubin 1.6 (*)    Anion gap 27 (*)    All other components within normal limits  CBC WITH DIFFERENTIAL/PLATELET - Abnormal; Notable for the following  components:   WBC 12.2 (*)    RBC 2.68 (*)    Hemoglobin 7.1 (*)    HCT 21.9 (*)    RDW 27.8 (*)    nRBC 0.5 (*)    Neutro Abs 11.1 (*)    All other components within normal limits  MAGNESIUM  - Abnormal; Notable for the following components:   Magnesium  0.9 (*)    All other components within normal limits  I-STAT CG4 LACTIC ACID, ED - Abnormal; Notable for the following components:   Lactic Acid, Venous 12.2 (*)    All other components within normal limits  I-STAT CG4 LACTIC ACID, ED    EKG: None  Radiology: No results found.   .Critical Care  Performed by: Pamella Ozell LABOR, DO Authorized by: Pamella Ozell LABOR, DO   Critical care provider statement:    Critical care time (minutes):  30   Critical care was necessary to treat or prevent imminent or life-threatening deterioration of the following conditions:  Metabolic crisis   Critical care was time spent personally by me on the following activities:  Development of treatment plan with patient or surrogate, discussions with  consultants, evaluation of patient's response to treatment, examination of patient, ordering and review of laboratory studies, ordering and review of radiographic studies, ordering and performing treatments and interventions, pulse oximetry, re-evaluation of patient's condition, review of old charts and obtaining history from patient or surrogate   I assumed direction of critical care for this patient from another provider in my specialty: no      Medications Ordered in the ED  sodium chloride  0.9 % bolus 1,000 mL (0 mLs Intravenous Stopped 10/24/23 2152)  levETIRAcetam  (KEPPRA ) undiluted injection 1,500 mg (1,500 mg Intravenous Given 10/24/23 1935)  magnesium  sulfate IVPB 2 g 50 mL (0 g Intravenous Stopped 10/24/23 2152)  potassium chloride  SA (KLOR-CON  M) CR tablet 40 mEq (40 mEq Oral Given 10/24/23 2028)  potassium chloride  10 mEq in 100 mL IVPB (0 mEq Intravenous Stopped 10/24/23 2152)                                    Medical Decision Making 38 year old male with history as above presents for recurrent seizure.  Noncompliant with Keppra .  Last seizure was in January of this year.  Drinks alcohol daily.  Has returned to baseline with no focal neurologic deficits.  No associated trauma and seizure was witnessed.  Laboratory workup notable for hypomagnesemia and hypokalemia both have been repleted here.  Discussed electrolyte abnormalities and need for alcohol cessation with the patient and his wife at bedside.  Considering significant electrolyte abnormalities and recurrent seizure offered admission however patient did not wish to come into the hospital at this time.  Will refill his Keppra  and provide oral magnesium  supplementation for him to pick up from the pharmacy as well.  Will instruct him to follow-up with his neurologist and return for recurrent seizures.  Seizure precautions were discussed with the patient and his wife  Amount and/or Complexity of Data Reviewed Labs:  ordered.  Risk OTC drugs. Prescription drug management.        Final diagnoses:  Seizure (HCC)  Hypomagnesemia  Hypokalemia    ED Discharge Orders          Ordered    levETIRAcetam  (KEPPRA ) 500 MG tablet  2 times daily        10/24/23 1901    Magnesium  400  MG TABS  Daily        10/24/23 2224               Pamella Ozell LABOR, DO 10/24/23 2333

## 2023-10-24 NOTE — ED Triage Notes (Signed)
 [facility] presenting with patient from home with CC of witness seizure, tonic clonic - lasting about 3 mins according to wife - last seizure before now was back in January. Has hx of seizures however does not take seizure medications according to EMS.   EMS states the patient was initially post ictal however came to GCS of 15.  EMS vitals and interventions: 18G LAC BP 164/100 HR 114 RR 18 O2 99 CBG 214  NO meds/EKG

## 2023-10-25 ENCOUNTER — Other Ambulatory Visit: Payer: Self-pay | Admitting: Nurse Practitioner

## 2023-10-25 DIAGNOSIS — F1020 Alcohol dependence, uncomplicated: Secondary | ICD-10-CM

## 2023-10-25 MED ORDER — NALTREXONE HCL 50 MG PO TABS: ORAL_TABLET | ORAL | 1 refills | Status: AC

## 2023-10-28 ENCOUNTER — Other Ambulatory Visit: Payer: Self-pay | Admitting: Nurse Practitioner

## 2023-10-28 DIAGNOSIS — F10288 Alcohol dependence with other alcohol-induced disorder: Secondary | ICD-10-CM

## 2023-10-28 DIAGNOSIS — D649 Anemia, unspecified: Secondary | ICD-10-CM

## 2023-10-28 DIAGNOSIS — R748 Abnormal levels of other serum enzymes: Secondary | ICD-10-CM

## 2024-01-06 ENCOUNTER — Other Ambulatory Visit: Payer: Self-pay

## 2024-01-08 ENCOUNTER — Other Ambulatory Visit: Payer: Self-pay

## 2024-01-13 ENCOUNTER — Ambulatory Visit: Admission: EM | Admit: 2024-01-13 | Discharge: 2024-01-13 | Disposition: A | Discharge status: Home / Self Care

## 2024-01-13 DIAGNOSIS — R5383 Other fatigue: Secondary | ICD-10-CM | POA: Diagnosis not present

## 2024-01-13 DIAGNOSIS — R531 Weakness: Secondary | ICD-10-CM

## 2024-01-13 DIAGNOSIS — R112 Nausea with vomiting, unspecified: Secondary | ICD-10-CM

## 2024-01-13 DIAGNOSIS — R000 Tachycardia, unspecified: Secondary | ICD-10-CM

## 2024-01-13 LAB — POCT INFLUENZA A/B
Influenza A, POC: NEGATIVE
Influenza B, POC: NEGATIVE

## 2024-01-13 LAB — POC SOFIA SARS ANTIGEN FIA: SARS Coronavirus 2 Ag: NEGATIVE

## 2024-01-13 MED ORDER — ONDANSETRON 8 MG PO TBDP
8.0000 mg | ORAL_TABLET | Freq: Once | ORAL | Status: AC
  Administered 2024-01-13: 8 mg via ORAL

## 2024-01-13 MED ORDER — ACETAMINOPHEN 325 MG PO TABS
650.0000 mg | ORAL_TABLET | Freq: Once | ORAL | Status: AC
  Administered 2024-01-13: 650 mg via ORAL

## 2024-01-13 NOTE — ED Provider Notes (Signed)
 " [name]    CSN: [id number] Arrival date & time: 01/13/24  1038      History   Chief Complaint Chief Complaint  Patient presents with   Fatigue   Emesis    HPI Jimmy Howard is a 39 y.o. male.   Pt with a hx of seizures and HTN, presents today due to 2 days of weakness, fatigue, chills, body aches, and 4 episodes of vomiting this am. Pt denies known sick contacts or fever. Pt denies use of medicine for symptoms. Pt states that he has been without seizure meds for at least 2 months but is taking BP meds. Pt is hypertensive despite use of BP meds, tachycardic, and neg for covid and flu in office.   The history is provided by the patient.  Emesis   Past Medical History:  Diagnosis Date   Hypertension    Seizures (HCC)     There are no active problems to display for this patient.   No past surgical history on file.     Home Medications    Prior to Admission medications  Medication Sig Start Date End Date Taking? Authorizing Provider  amLODipine  (NORVASC ) 10 MG tablet Take 1 tablet (10 mg total) by mouth daily. 06/12/23  Yes Fleming, Zelda W, NP  losartan  (COZAAR ) 25 MG tablet Take 1 tablet (25 mg total) by mouth daily. 06/12/23  Yes Fleming, Zelda W, NP  levETIRAcetam  (KEPPRA ) 500 MG tablet Take 1 tablet (500 mg total) by mouth 2 (two) times daily. Patient not taking: Reported on 01/13/2024 10/24/23   Pamella Ozell DELENA, DO  naltrexone  (DEPADE) 50 MG tablet Start with half tablet for 4 days. Then increase to a whole tablet once daily. Increase to 2 tablets after 1 week if needed. Patient not taking: Reported on 01/13/2024 10/25/23   Theotis Haze ORN, NP    Family History Family History  Family history unknown: Yes    Social History Social History[1]   Allergies   Patient has no known allergies.   Review of Systems Review of Systems  Gastrointestinal:  Positive for vomiting.     Physical Exam Triage Vital Signs ED Triage Vitals  Encounter  Vitals Group     BP 01/13/24 1056 (!) 153/111     Girls Systolic BP Percentile --      Girls Diastolic BP Percentile --      Boys Systolic BP Percentile --      Boys Diastolic BP Percentile --      Pulse Rate 01/13/24 1056 (!) 137     Resp 01/13/24 1056 20     Temp 01/13/24 1056 99 F (37.2 C)     Temp Source 01/13/24 1056 Oral     SpO2 01/13/24 1056 97 %     Weight --      Height --      Head Circumference --      Peak Flow --      Pain Score 01/13/24 1051 5     Pain Loc --      Pain Education --      Exclude from Growth Chart --    No data found.  Updated Vital Signs BP (!) 154/102 (BP Location: Left Arm)   Pulse (!) 111   Temp 99.1 F (37.3 C) (Oral)   Resp 20   SpO2 97%   Visual Acuity Right Eye Distance:   Left Eye Distance:   Bilateral Distance:    Right Eye Near:  Left Eye Near:    Bilateral Near:     Physical Exam Vitals and nursing note reviewed.  Constitutional:      General: He is not in acute distress.    Appearance: Normal appearance. He is not ill-appearing, toxic-appearing or diaphoretic.  Eyes:     General: No scleral icterus. Cardiovascular:     Rate and Rhythm: Tachycardia present.     Heart sounds: Normal heart sounds.  Pulmonary:     Effort: Pulmonary effort is normal. No respiratory distress.     Breath sounds: Normal breath sounds. No wheezing or rhonchi.  Skin:    General: Skin is warm.  Neurological:     Mental Status: He is alert and oriented to person, place, and time.  Psychiatric:        Mood and Affect: Mood normal.        Behavior: Behavior normal.      UC Treatments / Results  Labs (all labs ordered are listed, but only abnormal results are displayed) Labs Reviewed  POCT INFLUENZA A/B - Normal  POC SOFIA SARS ANTIGEN FIA    EKG   Radiology No results found.  Procedures Procedures (including critical care time)  Medications Ordered in UC Medications  acetaminophen  (TYLENOL ) tablet 650 mg (650 mg Oral  Given 01/13/24 1109)  ondansetron  (ZOFRAN -ODT) disintegrating tablet 8 mg (8 mg Oral Given 01/13/24 1110)    Initial Impression / Assessment and Plan / UC Course  I have reviewed the triage vital signs and the nursing notes.  Pertinent labs & imaging results that were available during my care of the patient were reviewed by me and considered in my medical decision making (see chart for details).    Final Clinical Impressions(s) / UC Diagnoses   Final diagnoses:  Nausea and vomiting, unspecified vomiting type  Fatigue, unspecified type  Tachycardia  Weakness     Discharge Instructions      Please proceed to ED for further evaluation of symptoms.     ED Prescriptions   None    PDMP not reviewed this encounter.    [1]  Social History Tobacco Use   Smoking status: Every Day    Types: Cigarettes   Smokeless tobacco: Never  Vaping Use   Vaping status: Never Used  Substance Use Topics   Alcohol use: Yes    Comment: occ.   Drug use: Yes    Types: Marijuana    Comment: 2x/weekly     Andra Corean BROCKS, PA-C 01/13/24 1144  "

## 2024-01-13 NOTE — ED Triage Notes (Signed)
 Pt reports feeling weak since Saturday with chills hot/cold, body aches, nausea, emesis (4 times today), no energy. Denies known fever. No meds tried. He states he has only been taking BP meds. Last seizure it's been a month or 2

## 2024-01-13 NOTE — ED Notes (Signed)
 Patient is being discharged from the Urgent Care and sent to the Emergency Department via POV . Per Corean Endo, PA-C, patient is in need of higher level of care due to emesis and tachycardia. Patient is aware and verbalizes understanding of plan of care.  Vitals:   01/13/24 1056 01/13/24 1136  BP: (!) 153/111 (!) 154/102  Pulse: (!) 137 (!) 111  Resp: 20 20  Temp: 99 F (37.2 C) 99.1 F (37.3 C)  SpO2: 97% 97%

## 2024-01-13 NOTE — Discharge Instructions (Signed)
 Please proceed to ED for further evaluation of symptoms.

## 2024-01-14 ENCOUNTER — Other Ambulatory Visit: Payer: Self-pay

## 2024-01-14 ENCOUNTER — Emergency Department (HOSPITAL_COMMUNITY)
Admission: EM | Admit: 2024-01-14 | Discharge: 2024-01-14 | Discharge status: Against Medical Advice | Payer: Self-pay | Attending: Emergency Medicine | Admitting: Emergency Medicine

## 2024-01-14 DIAGNOSIS — R531 Weakness: Secondary | ICD-10-CM | POA: Insufficient documentation

## 2024-01-14 DIAGNOSIS — R5381 Other malaise: Secondary | ICD-10-CM | POA: Insufficient documentation

## 2024-01-14 DIAGNOSIS — Z5321 Procedure and treatment not carried out due to patient leaving prior to being seen by health care provider: Secondary | ICD-10-CM | POA: Insufficient documentation

## 2024-01-14 DIAGNOSIS — R34 Anuria and oliguria: Secondary | ICD-10-CM | POA: Insufficient documentation

## 2024-01-14 DIAGNOSIS — R42 Dizziness and giddiness: Secondary | ICD-10-CM | POA: Insufficient documentation

## 2024-01-14 DIAGNOSIS — R0602 Shortness of breath: Secondary | ICD-10-CM | POA: Insufficient documentation

## 2024-01-14 DIAGNOSIS — R112 Nausea with vomiting, unspecified: Secondary | ICD-10-CM | POA: Insufficient documentation

## 2024-01-14 DIAGNOSIS — R5383 Other fatigue: Secondary | ICD-10-CM | POA: Insufficient documentation

## 2024-01-14 LAB — CBC WITH DIFFERENTIAL/PLATELET
Abs Immature Granulocytes: 0.1 K/uL — ABNORMAL HIGH (ref 0.00–0.07)
Basophils Absolute: 0.1 K/uL (ref 0.0–0.1)
Basophils Relative: 1 %
Eosinophils Absolute: 0.1 K/uL (ref 0.0–0.5)
Eosinophils Relative: 1 %
HCT: 23.1 % — ABNORMAL LOW (ref 39.0–52.0)
Hemoglobin: 7.7 g/dL — ABNORMAL LOW (ref 13.0–17.0)
Immature Granulocytes: 1 %
Lymphocytes Relative: 15 %
Lymphs Abs: 1.4 K/uL (ref 0.7–4.0)
MCH: 24.7 pg — ABNORMAL LOW (ref 26.0–34.0)
MCHC: 33.3 g/dL (ref 30.0–36.0)
MCV: 74 fL — ABNORMAL LOW (ref 80.0–100.0)
Monocytes Absolute: 0.6 K/uL (ref 0.1–1.0)
Monocytes Relative: 7 %
Neutro Abs: 6.8 K/uL (ref 1.7–7.7)
Neutrophils Relative %: 75 %
Platelets: 157 K/uL (ref 150–400)
RBC: 3.12 MIL/uL — ABNORMAL LOW (ref 4.22–5.81)
RDW: 28.6 % — ABNORMAL HIGH (ref 11.5–15.5)
Smear Review: NORMAL
WBC: 9.1 K/uL (ref 4.0–10.5)
nRBC: 0.8 % — ABNORMAL HIGH (ref 0.0–0.2)

## 2024-01-14 LAB — COMPREHENSIVE METABOLIC PANEL WITH GFR
ALT: 6 U/L (ref 0–44)
AST: 69 U/L — ABNORMAL HIGH (ref 15–41)
Albumin: 4.8 g/dL (ref 3.5–5.0)
Alkaline Phosphatase: 100 U/L (ref 38–126)
Anion gap: 16 — ABNORMAL HIGH (ref 5–15)
BUN: 10 mg/dL (ref 6–20)
CO2: 29 mmol/L (ref 22–32)
Calcium: 8.2 mg/dL — ABNORMAL LOW (ref 8.9–10.3)
Chloride: 86 mmol/L — ABNORMAL LOW (ref 98–111)
Creatinine, Ser: 1.25 mg/dL — ABNORMAL HIGH (ref 0.61–1.24)
GFR, Estimated: >60 mL/min
Glucose, Bld: 133 mg/dL — ABNORMAL HIGH (ref 70–99)
Potassium: 2.5 mmol/L — CL (ref 3.5–5.1)
Sodium: 132 mmol/L — ABNORMAL LOW (ref 135–145)
Total Bilirubin: 1.3 mg/dL — ABNORMAL HIGH (ref 0.0–1.2)
Total Protein: 8.3 g/dL — ABNORMAL HIGH (ref 6.5–8.1)

## 2024-01-14 LAB — LIPASE, BLOOD: Lipase: 44 U/L (ref 11–51)

## 2024-01-14 LAB — I-STAT CG4 LACTIC ACID, ED: Lactic Acid, Venous: 0.9 mmol/L (ref 0.5–1.9)

## 2024-01-14 MED ORDER — ONDANSETRON 4 MG PO TBDP
4.0000 mg | ORAL_TABLET | Freq: Once | ORAL | Status: AC
  Administered 2024-01-14: 4 mg via ORAL
  Filled 2024-01-14: qty 1

## 2024-01-14 NOTE — ED Notes (Signed)
 Called for room, no response.

## 2024-01-14 NOTE — ED Notes (Signed)
 Unable to Urinate in triage.

## 2024-01-14 NOTE — ED Triage Notes (Addendum)
 Pt. 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 I drove myself. Ive had previous episodes since  July 2025

## 2024-01-14 NOTE — ED Provider Triage Note (Signed)
 Emergency Medicine Provider Triage Evaluation Note  Jimmy Howard , a 39 y.o. male  was evaluated in triage.  Pt complains of 2 days of nausea, vomiting, fatigue, diarrhea, decreased urine, and malaise.  Review of Systems  Positive: Nausea, vomiting, diarrhea.  Decreased urine, fatigue, malaise. Negative: Chest pain, shortness of breath, cough, fevers, or chills  Physical Exam  BP (!) 143/91   Pulse 95   Temp 98.3 F (36.8 C)   Resp 18   Ht 6' (1.829 m)   Wt 70.3 kg   SpO2 99%   BMI 21.02 kg/m  Gen:   Awake, no distress   Resp:  Normal effort  MSK:   Moves extremities without difficulty  Other:    Medical Decision Making  Medically screening exam initiated at 10:43 AM.  Appropriate orders placed.  Jimmy Howard was informed that the remainder of the evaluation will be completed by another provider, this initial triage assessment does not replace that evaluation, and the importance of remaining in the ED until their evaluation is complete.  Jimmy Howard is a 39 y.o. male with a past medical history significant for seizures and hypertension who presents for nausea, vomiting, diarrhea, decreased urine, fatigue and malaise.  According to patient, for the last 2-1/2 days he has had the symptoms.  He is unsure of sick contacts but reports he started having the nausea and vomiting and diarrhea early yesterday morning.  He reports that he could not keep fluids down and when he saw an urgent care earlier, they told him his heart rate was very fast and need to come to the emergency department.  Here denies any palpitations chest pain or shortness of breath at this time and he also denies any abdominal pains.  He reports some occasional abdominal cramping but he thinks that is from the diarrhea and vomiting.  He reports his urine is decreased but he denies dysuria.  Denies any trauma.  Denies any head injury or neck injury.  Denies any other complaint.  On exam, lungs clear.  Chest  nontender.  Abdomen nontender.  I did hear good bowel sounds.  Mucous membranes are slightly dry.  No focal neurologic deficits.  Patient otherwise well-appearing.  Clinically suspect a viral gastroenteritis with nausea, vomiting, diarrhea, and some decreased urine.  With his decreased urine we will get urinalysis.  Get some screening labs to look for electrolyte disturbances.  Will give him some oral Zofran  and try oral p.o. challenge with fluids.  If he could not tolerate this he may need IV fluids but will start with oral fluids initially.  With his lack of focal tenderness we will hold on CT imaging.  With lack of URI symptoms congestion or cough we will hold on viral swab.  Will hold on chest x-ray given lack of chest pain shortness breath or cough.  Anticipate reassessment when he gets seen by provider to determine disposition.    Samwise Eckardt, Lonni PARAS, MD 01/14/24 1056

## 2024-01-17 ENCOUNTER — Other Ambulatory Visit: Payer: Self-pay

## 2024-01-17 ENCOUNTER — Other Ambulatory Visit: Payer: Self-pay | Admitting: Nurse Practitioner

## 2024-01-17 DIAGNOSIS — E875 Hyperkalemia: Secondary | ICD-10-CM

## 2024-01-18 MED ORDER — POTASSIUM CHLORIDE CRYS ER 20 MEQ PO TBCR
40.0000 meq | EXTENDED_RELEASE_TABLET | Freq: Every day | ORAL | 1 refills | Status: AC
  Filled 2024-01-18: qty 60, 30d supply, fill #0

## 2024-01-20 ENCOUNTER — Other Ambulatory Visit: Payer: Self-pay

## 2024-01-29 ENCOUNTER — Other Ambulatory Visit: Payer: Self-pay

## 2024-02-05 ENCOUNTER — Other Ambulatory Visit: Payer: Self-pay
